# Patient Record
Sex: Female | Born: 1990 | Race: White | Hispanic: No | Marital: Single | Smoking: Current every day smoker
Health system: Southern US, Community
[De-identification: ages and names within clinical notes are randomized; demographics above are authoritative.]

## PROBLEM LIST (undated history)

## (undated) ENCOUNTER — Inpatient Hospital Stay (HOSPITAL_COMMUNITY): Payer: Medicaid Other

## (undated) DIAGNOSIS — A5901 Trichomonal vulvovaginitis: Secondary | ICD-10-CM

## (undated) DIAGNOSIS — Z789 Other specified health status: Secondary | ICD-10-CM

## (undated) HISTORY — PX: NO PAST SURGERIES: SHX2092

---
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
## 2010-06-02 ENCOUNTER — Emergency Department: Payer: Self-pay | Admitting: Emergency Medicine

## 2010-06-06 ENCOUNTER — Emergency Department: Payer: Self-pay | Admitting: Emergency Medicine

## 2010-06-26 ENCOUNTER — Emergency Department (HOSPITAL_COMMUNITY)
Admission: EM | Admit: 2010-06-26 | Discharge: 2010-06-26 | Disposition: A | Discharge status: Home / Self Care | Payer: Self-pay | Attending: Emergency Medicine | Admitting: Emergency Medicine

## 2010-06-26 DIAGNOSIS — S62309A Unspecified fracture of unspecified metacarpal bone, initial encounter for closed fracture: Secondary | ICD-10-CM | POA: Insufficient documentation

## 2010-06-26 DIAGNOSIS — M79609 Pain in unspecified limb: Secondary | ICD-10-CM | POA: Insufficient documentation

## 2010-06-26 DIAGNOSIS — X58XXXA Exposure to other specified factors, initial encounter: Secondary | ICD-10-CM | POA: Insufficient documentation

## 2010-06-27 ENCOUNTER — Emergency Department: Payer: Self-pay | Admitting: Emergency Medicine

## 2010-09-27 ENCOUNTER — Emergency Department: Payer: Self-pay | Admitting: Emergency Medicine

## 2010-09-30 ENCOUNTER — Emergency Department: Payer: Self-pay | Admitting: Emergency Medicine

## 2010-12-05 ENCOUNTER — Emergency Department (HOSPITAL_COMMUNITY)
Admission: EM | Admit: 2010-12-05 | Discharge: 2010-12-05 | Disposition: A | Discharge status: Home / Self Care | Payer: Self-pay | Attending: Emergency Medicine | Admitting: Emergency Medicine

## 2010-12-05 ENCOUNTER — Emergency Department (HOSPITAL_COMMUNITY): Payer: Self-pay

## 2010-12-05 DIAGNOSIS — M79609 Pain in unspecified limb: Secondary | ICD-10-CM | POA: Insufficient documentation

## 2010-12-05 DIAGNOSIS — S6990XA Unspecified injury of unspecified wrist, hand and finger(s), initial encounter: Secondary | ICD-10-CM | POA: Insufficient documentation

## 2010-12-05 DIAGNOSIS — W19XXXA Unspecified fall, initial encounter: Secondary | ICD-10-CM | POA: Insufficient documentation

## 2010-12-05 DIAGNOSIS — S60229A Contusion of unspecified hand, initial encounter: Secondary | ICD-10-CM | POA: Insufficient documentation

## 2010-12-05 DIAGNOSIS — M7989 Other specified soft tissue disorders: Secondary | ICD-10-CM | POA: Insufficient documentation

## 2011-02-04 ENCOUNTER — Emergency Department: Payer: Self-pay | Admitting: *Deleted

## 2011-03-04 ENCOUNTER — Emergency Department (HOSPITAL_COMMUNITY)
Admission: EM | Admit: 2011-03-04 | Discharge: 2011-03-04 | Disposition: A | Discharge status: Home / Self Care | Payer: Self-pay | Attending: Emergency Medicine | Admitting: Emergency Medicine

## 2011-03-04 DIAGNOSIS — M549 Dorsalgia, unspecified: Secondary | ICD-10-CM | POA: Insufficient documentation

## 2011-03-04 DIAGNOSIS — O2390 Unspecified genitourinary tract infection in pregnancy, unspecified trimester: Secondary | ICD-10-CM | POA: Insufficient documentation

## 2011-03-04 DIAGNOSIS — R300 Dysuria: Secondary | ICD-10-CM | POA: Insufficient documentation

## 2011-03-04 DIAGNOSIS — N390 Urinary tract infection, site not specified: Secondary | ICD-10-CM | POA: Insufficient documentation

## 2011-03-04 LAB — URINE MICROSCOPIC-ADD ON

## 2011-03-04 LAB — URINALYSIS, ROUTINE W REFLEX MICROSCOPIC
Nitrite: NEGATIVE
Specific Gravity, Urine: 1.008 (ref 1.005–1.030)
Urobilinogen, UA: 0.2 mg/dL (ref 0.0–1.0)

## 2011-03-06 LAB — URINE CULTURE
Colony Count: >=100000
Culture  Setup Time: 201210192033

## 2011-03-16 LAB — OB RESULTS CONSOLE ANTIBODY SCREEN: Antibody Screen: NEGATIVE

## 2011-03-16 LAB — OB RESULTS CONSOLE RUBELLA ANTIBODY, IGM: Rubella: IMMUNE

## 2011-03-16 LAB — OB RESULTS CONSOLE VARICELLA ZOSTER ANTIBODY, IGG: Varicella: IMMUNE

## 2011-03-16 LAB — OB RESULTS CONSOLE ABO/RH

## 2011-03-16 LAB — OB RESULTS CONSOLE HEPATITIS B SURFACE ANTIGEN: Hepatitis B Surface Ag: NEGATIVE

## 2011-03-16 LAB — OB RESULTS CONSOLE PLATELET COUNT: Platelets: 282 10*3/uL

## 2011-05-17 NOTE — L&D Delivery Note (Signed)
Delivery Note At 9:59 AM a viable and healthy female was delivered via Vaginal, Spontaneous Delivery (Presentation: ; Occiput Anterior).  APGAR: 8, 9; weight .   Placenta status: Intact, Spontaneous.  Cord: 3 vessels with the following complications: .  Cord pH: N/a  Anesthesia: Epidural  Episiotomy: None Lacerations: None Suture Repair: none Est. Blood Loss (mL): 400  Mom to postpartum.  Baby to nursery-stable.  RIGBY, MICHAEL 10/25/2011, 10:17 AM  I was present for this delivery.  I agree with the above note. After delivery patient had 350 mL of bleeding.  Cytotec PR given with uterine massage with good results.    Levie Heritage, DO 10/25/2011 10:31 AM

## 2011-09-29 LAB — OB RESULTS CONSOLE GBS: GBS: NEGATIVE

## 2011-10-14 ENCOUNTER — Observation Stay: Payer: Self-pay | Admitting: Obstetrics and Gynecology

## 2011-10-24 ENCOUNTER — Inpatient Hospital Stay (HOSPITAL_COMMUNITY): Payer: Medicaid Other | Admitting: Anesthesiology

## 2011-10-24 ENCOUNTER — Inpatient Hospital Stay (HOSPITAL_COMMUNITY)
Admission: AD | Admit: 2011-10-24 | Discharge: 2011-10-26 | DRG: 775 | Disposition: A | Discharge status: Home / Self Care | Payer: Medicaid Other | Attending: Obstetrics and Gynecology | Admitting: Obstetrics and Gynecology

## 2011-10-24 ENCOUNTER — Encounter (HOSPITAL_COMMUNITY): Payer: Self-pay | Admitting: Anesthesiology

## 2011-10-24 ENCOUNTER — Encounter (HOSPITAL_COMMUNITY): Payer: Self-pay | Admitting: *Deleted

## 2011-10-24 ENCOUNTER — Observation Stay: Payer: Self-pay | Admitting: Advanced Practice Midwife

## 2011-10-24 DIAGNOSIS — IMO0001 Reserved for inherently not codable concepts without codable children: Secondary | ICD-10-CM

## 2011-10-24 DIAGNOSIS — O99334 Smoking (tobacco) complicating childbirth: Principal | ICD-10-CM | POA: Diagnosis present

## 2011-10-24 HISTORY — DX: Other specified health status: Z78.9

## 2011-10-24 LAB — CBC
HCT: 29.5 % — ABNORMAL LOW (ref 36.0–46.0)
Hemoglobin: 9.9 g/dL — ABNORMAL LOW (ref 12.0–15.0)
MCV: 87.8 fL (ref 78.0–100.0)
RDW: 13.9 % (ref 11.5–15.5)
WBC: 14.4 10*3/uL — ABNORMAL HIGH (ref 4.0–10.5)

## 2011-10-24 MED ORDER — OXYTOCIN 20 UNITS IN LACTATED RINGERS INFUSION - SIMPLE
1.0000 m[IU]/min | INTRAVENOUS | Status: DC
  Administered 2011-10-24: 1 m[IU]/min via INTRAVENOUS
  Filled 2011-10-24: qty 1000

## 2011-10-24 MED ORDER — PHENYLEPHRINE 40 MCG/ML (10ML) SYRINGE FOR IV PUSH (FOR BLOOD PRESSURE SUPPORT): 80.0000 ug | PREFILLED_SYRINGE | INTRAVENOUS | Status: DC | PRN

## 2011-10-24 MED ORDER — CITRIC ACID-SODIUM CITRATE 334-500 MG/5ML PO SOLN: 30.0000 mL | ORAL | Status: DC | PRN

## 2011-10-24 MED ORDER — IBUPROFEN 600 MG PO TABS: 600.0000 mg | ORAL_TABLET | Freq: Four times a day (QID) | ORAL | Status: DC | PRN

## 2011-10-24 MED ORDER — DIPHENHYDRAMINE HCL 50 MG/ML IJ SOLN
12.5000 mg | INTRAMUSCULAR | Status: DC | PRN
  Administered 2011-10-25: 12.5 mg via INTRAVENOUS
  Filled 2011-10-24: qty 1

## 2011-10-24 MED ORDER — NALBUPHINE SYRINGE 5 MG/0.5 ML
10.0000 mg | INJECTION | INTRAMUSCULAR | Status: DC | PRN
  Administered 2011-10-24: 10 mg via INTRAVENOUS
  Filled 2011-10-24: qty 1

## 2011-10-24 MED ORDER — ONDANSETRON HCL 4 MG/2ML IJ SOLN
4.0000 mg | Freq: Four times a day (QID) | INTRAMUSCULAR | Status: DC | PRN
  Administered 2011-10-24: 4 mg via INTRAVENOUS
  Filled 2011-10-24: qty 2

## 2011-10-24 MED ORDER — PHENYLEPHRINE 40 MCG/ML (10ML) SYRINGE FOR IV PUSH (FOR BLOOD PRESSURE SUPPORT)
80.0000 ug | PREFILLED_SYRINGE | INTRAVENOUS | Status: DC | PRN
  Filled 2011-10-24: qty 5

## 2011-10-24 MED ORDER — LIDOCAINE HCL (PF) 1 % IJ SOLN
INTRAMUSCULAR | Status: DC | PRN
  Administered 2011-10-24 (×2): 8 mL

## 2011-10-24 MED ORDER — LACTATED RINGERS IV SOLN
INTRAVENOUS | Status: DC
  Administered 2011-10-25: 06:00:00 via INTRAVENOUS

## 2011-10-24 MED ORDER — EPHEDRINE 5 MG/ML INJ: 10.0000 mg | INTRAVENOUS | Status: DC | PRN

## 2011-10-24 MED ORDER — FENTANYL 2.5 MCG/ML BUPIVACAINE 1/10 % EPIDURAL INFUSION (WH - ANES)
INTRAMUSCULAR | Status: DC | PRN
  Administered 2011-10-24: 14 mL/h via EPIDURAL

## 2011-10-24 MED ORDER — OXYTOCIN BOLUS FROM INFUSION
500.0000 mL | Freq: Once | INTRAVENOUS | Status: AC
  Administered 2011-10-25: 500 mL via INTRAVENOUS
  Filled 2011-10-24: qty 500

## 2011-10-24 MED ORDER — EPHEDRINE 5 MG/ML INJ
10.0000 mg | INTRAVENOUS | Status: DC | PRN
  Filled 2011-10-24: qty 4

## 2011-10-24 MED ORDER — SODIUM CHLORIDE 0.9 % IJ SOLN
INTRAMUSCULAR | Status: AC
  Filled 2011-10-24: qty 3

## 2011-10-24 MED ORDER — TERBUTALINE SULFATE 1 MG/ML IJ SOLN: 0.2500 mg | Freq: Once | INTRAMUSCULAR | Status: AC | PRN

## 2011-10-24 MED ORDER — LACTATED RINGERS IV SOLN: 500.0000 mL | Freq: Once | INTRAVENOUS | Status: DC

## 2011-10-24 MED ORDER — OXYCODONE-ACETAMINOPHEN 5-325 MG PO TABS: 1.0000 | ORAL_TABLET | ORAL | Status: DC | PRN

## 2011-10-24 MED ORDER — LACTATED RINGERS IV SOLN
500.0000 mL | INTRAVENOUS | Status: DC | PRN
  Administered 2011-10-25: 1000 mL via INTRAVENOUS

## 2011-10-24 MED ORDER — OXYTOCIN 20 UNITS IN LACTATED RINGERS INFUSION - SIMPLE: 125.0000 mL/h | Freq: Once | INTRAVENOUS | Status: DC

## 2011-10-24 MED ORDER — NALBUPHINE SYRINGE 5 MG/0.5 ML
5.0000 mg | INJECTION | INTRAMUSCULAR | Status: DC | PRN
  Administered 2011-10-24: 5 mg via INTRAVENOUS
  Filled 2011-10-24: qty 0.5

## 2011-10-24 MED ORDER — ACETAMINOPHEN 325 MG PO TABS: 650.0000 mg | ORAL_TABLET | ORAL | Status: DC | PRN

## 2011-10-24 MED ORDER — LIDOCAINE HCL (PF) 1 % IJ SOLN
30.0000 mL | INTRAMUSCULAR | Status: DC | PRN
  Filled 2011-10-24 (×2): qty 30

## 2011-10-24 MED ORDER — FLEET ENEMA 7-19 GM/118ML RE ENEM: 1.0000 | ENEMA | RECTAL | Status: DC | PRN

## 2011-10-24 MED ORDER — FENTANYL 2.5 MCG/ML BUPIVACAINE 1/10 % EPIDURAL INFUSION (WH - ANES)
14.0000 mL/h | INTRAMUSCULAR | Status: DC
  Administered 2011-10-25 (×3): 14 mL/h via EPIDURAL
  Filled 2011-10-24 (×4): qty 60

## 2011-10-24 NOTE — Progress Notes (Signed)
Tricia Miles is a 21 y.o. G1P0000 at [redacted]w[redacted]d   Subjective: Comfortable with epid  Objective: BP 122/80  Pulse 72  Temp(Src) 97.7 F (36.5 C) (Axillary)  Resp 18  Ht 5\' 7"  (1.702 m)  Wt 69.4 kg (153 lb)  BMI 23.96 kg/m2  SpO2 100%  LMP 11/28/2010      FHT:  FHR: 115 bpm, variability: moderate,  accelerations:  Present,  decelerations:  Absent UC:   irregular, every 4-10 minutes, 40-50sec SVE:   Dilation: 2.5 Effacement (%): 80 Station: -1 Exam by:: Kim- unchanged  Labs: Lab Results  Component Value Date   WBC 14.4* 10/24/2011   HGB 9.9* 10/24/2011   HCT 29.5* 10/24/2011   MCV 87.8 10/24/2011   PLT 156 10/24/2011    Assessment / Plan: IUP at term Latent vs false labor Epidural   Questioning whether pt is actually in labor, but already with epidural in place Will begin Pitocin   Cam Hai 10/24/2011, 9:48 PM

## 2011-10-24 NOTE — ED Notes (Signed)
Grant Fontana CNM to transfer patient via [facility] to [facility] for further evaluation. Will notify MAU charge and FP providers.

## 2011-10-24 NOTE — ED Notes (Signed)
[facility] here to [facility]

## 2011-10-24 NOTE — ED Provider Notes (Signed)
History     CSN: [id number]  Arrival date & time 10/24/11  1302   First MD Initiated Contact with Patient 10/24/11 1323      Chief Complaint  Patient presents with  . pregnant overdue/contracting     (Consider location/radiation/quality/duration/timing/severity/associated sxs/prior treatment) HPI History from patient. 21 year old female who presents with contractions. She reports that this is her first pregnancy and her due date was yesterday. She lives in [place] and her OB is in [place] - states she has an appt later this week to discuss induction. She reports that she began to have what feels like contractions at around 4 AM this morning, that are now about 7-10 min apart. Denies any loss of clear fluid but has noted some dark, bloody-appearing vaginal discharge. Last menstrual period was early September. Pregnancy has been uncomplicated thus far. Denies any pain or other sx other than ctrx. Level V caveat for urgent need for intervention.  History reviewed. No pertinent past medical history.  History reviewed. No pertinent past surgical history.  No family history on file.  History  Substance Use Topics  . Smoking status: Current Everyday Smoker  . Smokeless tobacco: Not on file  . Alcohol Use: No    OB History    Grav Para Term Preterm Abortions TAB SAB Ect Mult Living   1               Review of Systems  Unable to perform ROS: Other    Allergies  Tramadol and Vicodin  Home Medications  No current outpatient prescriptions on file.  BP 126/88  Pulse 100  Temp(Src) 98.2 F (36.8 C) (Oral)  Resp 23  SpO2 100%  LMP 11/28/2010  Physical Exam  Nursing note and vitals reviewed. Constitutional: She appears well-developed and well-nourished. No distress.  HENT:  Head: Normocephalic and atraumatic.  Neck: Normal range of motion.  Cardiovascular: Normal rate, regular rhythm and normal heart sounds.   Pulmonary/Chest: Effort normal and breath sounds normal.  She exhibits no tenderness.  Abdominal: Soft.       Gravid, appears at term  Genitourinary:       Deferred to RN  Musculoskeletal: Normal range of motion.  Neurological: She is alert.  Skin: Skin is warm and dry. She is not diaphoretic.  Psychiatric: She has a normal mood and affect.    ED Course  Procedures (including critical care time)  Labs Reviewed - No data to display No results found.   1. Active labor       MDM  1:16 PM Spoke with rapid response OB RN by phone  1:20 PM OB RN at bedside  1:35 PM RN reports cervix is ~1.5cm dilated, 50% effaced with vertex presentation and what appears to be reg ctrx  1:59 PM [facility] is at bedside to [facility], with Rana Snare as accepting MD      Grant Fontana, PA-C 10/24/11 1401

## 2011-10-24 NOTE — Anesthesia Procedure Notes (Signed)
Epidural Patient location during procedure: OB Start time: 10/24/2011 8:37 PM End time: 10/24/2011 8:41 PM Reason for block: procedure for pain  Staffing Anesthesiologist: Sandrea Hughs Performed by: anesthesiologist   Preanesthetic Checklist Completed: patient identified, site marked, surgical consent, pre-op evaluation, timeout performed, IV checked, risks and benefits discussed and monitors and equipment checked  Epidural Patient position: sitting Prep: site prepped and draped and DuraPrep Patient monitoring: continuous pulse ox and blood pressure Approach: midline Injection technique: LOR air  Needle:  Needle type: Tuohy  Needle gauge: 17 G Needle length: 9 cm Needle insertion depth: 5 cm cm Catheter type: closed end flexible Catheter size: 19 Gauge Catheter at skin depth: 10 cm Test dose: negative and Other  Assessment Sensory level: T9 Events: blood not aspirated, injection not painful, no injection resistance, negative IV test and no paresthesia

## 2011-10-24 NOTE — ED Notes (Signed)
Notified MAU charge and D. Poe CNM of patients status and that Patient to be transferred to MAU via [facility].

## 2011-10-24 NOTE — H&P (Signed)
Tricia Miles is a 21 y.o. female presenting for contractions following transfer from [facility] ED.  Maternal Medical History:  Reason for admission: Reason for admission: contractions.  Reason for Admission:   nauseaContractions: Onset was 6-12 hours ago.   Frequency: regular.   Perceived severity is strong.    Fetal activity: Perceived fetal activity is normal.   Last perceived fetal movement was within the past hour.    Prenatal complications: Substance abuse (Cigarettes).   No cholelithiasis, HIV, hypertension, infection, IUGR, nephrolithiasis, oligohydramnios, placental abnormality, polyhydramnios, pre-eclampsia, preterm labor, thrombocytopenia or thrombophilia.   Bleeding: with loss of mucous plug; no bright red blood.   Followed by [facility] in [place], [place]; was to delivery at [facility] but pt states she never intended to deliver there.  Has not informed her primary OB of this  Prenatal Complications - Diabetes: none.    OB History    Grav Para Term Preterm Abortions TAB SAB Ect Mult Living   1 0 0 0 0 0 0 0 0 0      Past Medical History  Diagnosis Date  . No pertinent past medical history    Past Surgical History  Procedure Date  . No past surgeries    Family History: family history is negative for Anesthesia problems. Social History:  reports that she has been smoking Cigarettes.  She has been smoking about .3 packs per day. She does not have any smokeless tobacco history on file. She reports that she does not drink alcohol or use illicit drugs.  Review of Systems  Constitutional: Negative for fever and chills.  HENT: Negative for congestion.   Eyes: Negative for blurred vision and double vision.  Respiratory: Negative for cough.   Cardiovascular: Negative for chest pain, palpitations and claudication.  Gastrointestinal: Positive for abdominal pain (with contractions). Negative for heartburn, nausea, vomiting, diarrhea, constipation, blood in stool and melena.    Genitourinary: Negative for dysuria, urgency and frequency.  Musculoskeletal: Negative.   Skin: Negative.   Neurological: Negative.  Negative for headaches.  Endo/Heme/Allergies: Negative.   Psychiatric/Behavioral: Negative.     Dilation: 2.5 Effacement (%): 80 Station: +1 Exam by:: D Poe, CNM Blood pressure 129/67, pulse 90, temperature 97.9 F (36.6 C), temperature source Oral, resp. rate 18, height 5\' 7"  (1.702 m), weight 69.4 kg (153 lb), last menstrual period 11/28/2010, SpO2 100.00%. Maternal Exam:  Uterine Assessment: Contraction strength is firm.  Contraction frequency is regular.   Abdomen: Fundal height is appropriate for age.   Fetal presentation: vertex  Introitus: Normal vulva. Normal vagina.  Ferning test: not done.  Nitrazine test: not done. Amniotic fluid character: not assessed.  Pelvis: adequate for delivery.   Cervix: Cervix evaluated by digital exam.     Fetal Exam Fetal Monitor Review: Mode: ultrasound.   Baseline rate: 120.  Variability: moderate (6-25 bpm).   Pattern: accelerations present and no decelerations.    Fetal State Assessment: Category I - tracings are normal.    Dilation: 2.5 Effacement (%): 80 Cervical Position: Posterior Station: +1 Presentation: Vertex Exam by:: D Poe, CNM  Physical Exam  Constitutional: She is oriented to person, place, and time. She appears well-developed and well-nourished. She appears distressed (with contractions).  HENT:  Head: Normocephalic and atraumatic.  Eyes: Conjunctivae are normal. Right eye exhibits no discharge. Left eye exhibits no discharge. No scleral icterus.  Cardiovascular: Normal rate, regular rhythm, normal heart sounds and intact distal pulses.  Exam reveals no gallop and no friction rub.   No  murmur heard. Respiratory: Effort normal and breath sounds normal. No respiratory distress. She has no wheezes. She has no rales. She exhibits no tenderness.  GI: Soft. Bowel sounds are normal.  She exhibits distension and mass. There is no tenderness. There is no rebound and no guarding.  Genitourinary: Vagina normal and uterus normal.  Musculoskeletal: Normal range of motion. She exhibits no edema and no tenderness.  Neurological: She is alert and oriented to person, place, and time. She exhibits normal muscle tone. Coordination normal.  Skin: Skin is warm and dry. No rash noted. She is not diaphoretic. No erythema. No pallor.  Psychiatric: She has a normal mood and affect. Her behavior is normal. Judgment and thought content normal.    Prenatal labs: ABO, Rh: O/Positive/-- (10/31 1137) Antibody: Negative (10/31 1137) Rubella: Immune (10/31 1137) RPR: Nonreactive (10/31 1137)  HBsAg: Negative (10/31 1137)  HIV: Non-reactive (10/31 1137)  GBS: Negative (05/16 1647)   Assessment/Plan: Active labor with regular contractions and cervical effacement.  Will admit to L&D with expectant delivery.  Nubain for pain until cervical dilation >4cm.  ?Fetal Cardiac Evaluation given pt deferment of prenatal Fetal ECHO.  Tobacco Abuse: monitor, consider NRT if symptomatic but low nicotine dependence as only smoking 1/3 ppd at most   Andrena Mews, DO [facility] Resident - PGY-1 10/24/2011 6:00 PM

## 2011-10-24 NOTE — Care Management (Signed)
External monitors taken off per C. Williams CNM. Patient to be transferred via [facility] to [facility]U.

## 2011-10-24 NOTE — MAU Note (Signed)
Pt sent over from [facility] for labor eval.  Pt gets prenatal care in [place], states she never planned to deliver at [facility].  Reports small amount of vaginal bleeding.  States ucs started around 0400 every 2-3 minutes.  Denies any leaking of fluid.  + FM.

## 2011-10-24 NOTE — ED Notes (Signed)
Rapid response nurse at bedside.

## 2011-10-24 NOTE — ED Notes (Signed)
Pt transferred to [facility] in [place]. Report given to [facility] RN. Pt voiced understanding of need to transfer.

## 2011-10-24 NOTE — Anesthesia Preprocedure Evaluation (Signed)

## 2011-10-24 NOTE — ED Notes (Signed)
Pt is here with complaints of contractions.  This is patients first baby and reports contractions that started at 0400 with some dark blood vaginal discharge.  Pt rates contracting pains 10/10.

## 2011-10-24 NOTE — ED Provider Notes (Signed)
Medical screening examination/treatment/procedure(s) were conducted as a shared visit with non-physician practitioner(s) and myself.  I personally evaluated the patient during the encounter  Pt seen and evaluated- she is 40 and 1/7 weeks, contractions every 1-2 minutes, fetal heart rate good.  Pt seen by OB rapid response and transferred to MAU for further evaluation and management  Ethelda Chick, MD 10/24/11 1446

## 2011-10-25 ENCOUNTER — Encounter (HOSPITAL_COMMUNITY): Payer: Self-pay

## 2011-10-25 DIAGNOSIS — O99334 Smoking (tobacco) complicating childbirth: Secondary | ICD-10-CM

## 2011-10-25 LAB — ABO/RH: ABO/RH(D): O POS

## 2011-10-25 LAB — MRSA PCR SCREENING: MRSA by PCR: POSITIVE — AB

## 2011-10-25 MED ORDER — MISOPROSTOL 200 MCG PO TABS
1000.0000 ug | ORAL_TABLET | Freq: Once | ORAL | Status: AC
  Administered 2011-10-25: 1000 ug via RECTAL

## 2011-10-25 MED ORDER — MUPIROCIN 2 % EX OINT
1.0000 "application " | TOPICAL_OINTMENT | Freq: Two times a day (BID) | CUTANEOUS | Status: DC
  Administered 2011-10-25 – 2011-10-26 (×3): 1 via NASAL
  Filled 2011-10-25: qty 22

## 2011-10-25 MED ORDER — DIBUCAINE 1 % RE OINT: 1.0000 "application " | TOPICAL_OINTMENT | RECTAL | Status: DC | PRN

## 2011-10-25 MED ORDER — METHYLERGONOVINE MALEATE 0.2 MG/ML IJ SOLN: 0.2000 mg | INTRAMUSCULAR | Status: DC | PRN

## 2011-10-25 MED ORDER — METHYLERGONOVINE MALEATE 0.2 MG PO TABS: 0.2000 mg | ORAL_TABLET | ORAL | Status: DC | PRN

## 2011-10-25 MED ORDER — DIPHENHYDRAMINE HCL 25 MG PO CAPS: 25.0000 mg | ORAL_CAPSULE | Freq: Four times a day (QID) | ORAL | Status: DC | PRN

## 2011-10-25 MED ORDER — BENZOCAINE-MENTHOL 20-0.5 % EX AERO
1.0000 "application " | INHALATION_SPRAY | CUTANEOUS | Status: DC | PRN
  Filled 2011-10-25: qty 56

## 2011-10-25 MED ORDER — SENNOSIDES-DOCUSATE SODIUM 8.6-50 MG PO TABS
2.0000 | ORAL_TABLET | Freq: Every day | ORAL | Status: DC
  Administered 2011-10-25: 2 via ORAL

## 2011-10-25 MED ORDER — TETANUS-DIPHTH-ACELL PERTUSSIS 5-2.5-18.5 LF-MCG/0.5 IM SUSP: 0.5000 mL | Freq: Once | INTRAMUSCULAR | Status: DC

## 2011-10-25 MED ORDER — OXYCODONE-ACETAMINOPHEN 5-325 MG PO TABS
1.0000 | ORAL_TABLET | ORAL | Status: DC | PRN
Start: 2011-10-25 — End: 2011-10-26
  Administered 2011-10-25 (×3): 1 via ORAL
  Administered 2011-10-26 (×3): 2 via ORAL
  Administered 2011-10-26: 1 via ORAL
  Filled 2011-10-25: qty 1
  Filled 2011-10-25 (×2): qty 2
  Filled 2011-10-25 (×2): qty 1
  Filled 2011-10-25: qty 2
  Filled 2011-10-25: qty 1

## 2011-10-25 MED ORDER — ONDANSETRON HCL 4 MG/2ML IJ SOLN: 4.0000 mg | INTRAMUSCULAR | Status: DC | PRN

## 2011-10-25 MED ORDER — LANOLIN HYDROUS EX OINT: TOPICAL_OINTMENT | CUTANEOUS | Status: DC | PRN

## 2011-10-25 MED ORDER — WITCH HAZEL-GLYCERIN EX PADS: 1.0000 "application " | MEDICATED_PAD | CUTANEOUS | Status: DC | PRN

## 2011-10-25 MED ORDER — ONDANSETRON HCL 4 MG PO TABS: 4.0000 mg | ORAL_TABLET | ORAL | Status: DC | PRN

## 2011-10-25 MED ORDER — SIMETHICONE 80 MG PO CHEW: 80.0000 mg | CHEWABLE_TABLET | ORAL | Status: DC | PRN

## 2011-10-25 MED ORDER — ZOLPIDEM TARTRATE 5 MG PO TABS: 5.0000 mg | ORAL_TABLET | Freq: Every evening | ORAL | Status: DC | PRN

## 2011-10-25 MED ORDER — CHLORHEXIDINE GLUCONATE CLOTH 2 % EX PADS
6.0000 | MEDICATED_PAD | Freq: Every day | CUTANEOUS | Status: DC
  Administered 2011-10-25 – 2011-10-26 (×2): 6 via TOPICAL

## 2011-10-25 MED ORDER — PRENATAL MULTIVITAMIN CH
1.0000 | ORAL_TABLET | Freq: Every day | ORAL | Status: DC
  Filled 2011-10-25: qty 1

## 2011-10-25 MED ORDER — IBUPROFEN 600 MG PO TABS
600.0000 mg | ORAL_TABLET | Freq: Four times a day (QID) | ORAL | Status: DC
  Administered 2011-10-25 – 2011-10-26 (×6): 600 mg via ORAL
  Filled 2011-10-25 (×6): qty 1

## 2011-10-25 MED ORDER — MISOPROSTOL 200 MCG PO TABS
ORAL_TABLET | ORAL | Status: AC
  Administered 2011-10-25: 1000 ug via RECTAL
  Filled 2011-10-25: qty 5

## 2011-10-25 NOTE — Progress Notes (Signed)
Tricia Miles is a 21 y.o. G1P0000 at [redacted]w[redacted]d   Subjective: Comfortable with epid  Objective: BP 128/90  Pulse 89  Temp(Src) 98 F (36.7 C) (Oral)  Resp 18  Ht 5\' 7"  (1.702 m)  Wt 69.4 kg (153 lb)  BMI 23.96 kg/m2  SpO2 100%  LMP 11/28/2010   Total I/O In: -  Out: 550 [Urine:550]  FHT:  FHR: 120 bpm, variability: moderate,  accelerations:  Present,  decelerations:  Absent UC:   regular, every 3-4 minutes SVE:   Dilation: 7 Effacement (%): 100 Station: 0 Exam by:: Pincus Badder CNM  Labs: Lab Results  Component Value Date   WBC 14.4* 10/24/2011   HGB 9.9* 10/24/2011   HCT 29.5* 10/24/2011   MCV 87.8 10/24/2011   PLT 156 10/24/2011    Assessment / Plan: Active labor  Continue to keep ctx reg with Pitocin Reexamine cx in approx 2 hours Anticipate SVD  Tricia Miles 10/25/2011, 5:56 AM

## 2011-10-26 LAB — CBC
Platelets: 136 10*3/uL — ABNORMAL LOW (ref 150–400)
RBC: 2.85 MIL/uL — ABNORMAL LOW (ref 3.87–5.11)
WBC: 13.6 10*3/uL — ABNORMAL HIGH (ref 4.0–10.5)

## 2011-10-26 MED ORDER — OXYCODONE-ACETAMINOPHEN 5-325 MG PO TABS: 1.0000 | ORAL_TABLET | ORAL | Status: DC | PRN

## 2011-10-26 MED ORDER — IBUPROFEN 600 MG PO TABS: 600.0000 mg | ORAL_TABLET | Freq: Four times a day (QID) | ORAL | Status: AC

## 2011-10-26 NOTE — Progress Notes (Signed)
UR chart review completed.  

## 2011-10-26 NOTE — Discharge Instructions (Signed)

## 2011-10-26 NOTE — Anesthesia Postprocedure Evaluation (Signed)
Anesthesia Post Note  Patient: Tricia Miles  Procedure(s) Performed: * No procedures listed *  Anesthesia type: Epidural  Patient location: Mother/Baby  Post pain: Pain level controlled  Post assessment: Post-op Vital signs reviewed  Last Vitals: There were no vitals filed for this visit.  Post vital signs: Reviewed  Level of consciousness: awake  Complications: No apparent anesthesia complications

## 2011-10-26 NOTE — Discharge Summary (Signed)
Obstetric Discharge Summary Reason for Admission: Regular contractions/latent labor Prenatal Procedures: none Intrapartum Procedures: spontaneous vaginal delivery Postpartum Procedures: none Complications-Operative and Postpartum: none Hemoglobin  Date Value Range Status  10/26/2011 8.3* 12.0 - 15.0 g/dL Final  69/62/9528 41.3   Final     HCT  Date Value Range Status  10/26/2011 24.9* 36.0 - 46.0 % Final  03/16/2011 37   Final    Physical Exam:  General: alert, cooperative and no distress Heart: reg rate Lungs: effort nl Lochia: appropriate Uterine Fundus: firm DVT Evaluation: No evidence of DVT seen on physical exam.  Discharge Diagnoses: Term Pregnancy-delivered  Discharge Information: Date: 10/26/2011 Activity: pelvic rest Diet: routine Medications: PNV and Ibuprofen Condition: stable Instructions: refer to practice specific booklet Discharge to: home Follow-up Information    Follow up with [facility]. Schedule an appointment as soon as possible for a visit in 6 weeks.         Newborn Data: Live born female  Birth Weight: 8 lb (3629 g) APGAR: 8, 9  Home with mother.  Cam Hai 10/26/2011, 7:34 AM

## 2011-10-26 NOTE — H&P (Signed)
Agree with above note.  Tricia Miles 10/26/2011 9:45 AM   

## 2012-03-03 ENCOUNTER — Emergency Department: Payer: Self-pay | Admitting: Emergency Medicine

## 2012-05-16 NOTE — L&D Delivery Note (Signed)
Delivery Note At 8:17 AM a viable female was delivered via Vaginal, Spontaneous Delivery (Presentation: Left Occiput Anterior).  APGAR: 9, 9; weight 6 lb 14.6 oz (3135 g).   Placenta status: Intact, Spontaneous.  Cord: 3 vessels with the following complications: None.  Cord pH: N/A.  Anesthesia: None  Episiotomy: None Lacerations: None Est. Blood Loss (mL): 300  Mom to postpartum.  Baby to nursery-stable.  Everlene Other 10/29/2012, 9:09 AM

## 2012-05-16 NOTE — L&D Delivery Note (Signed)
I have seen and examined this patient and I agree with the above. Cam Hai 9:25 AM 10/29/2012

## 2012-06-08 LAB — OB RESULTS CONSOLE GC/CHLAMYDIA
Chlamydia: NEGATIVE
Gonorrhea: NEGATIVE

## 2012-07-10 ENCOUNTER — Observation Stay: Payer: Self-pay

## 2012-09-05 LAB — OB RESULTS CONSOLE RPR: RPR: NONREACTIVE

## 2012-09-05 LAB — OB RESULTS CONSOLE HIV ANTIBODY (ROUTINE TESTING): HIV: NONREACTIVE

## 2012-09-05 LAB — OB RESULTS CONSOLE RUBELLA ANTIBODY, IGM: Rubella: IMMUNE

## 2012-10-03 ENCOUNTER — Observation Stay: Payer: Self-pay | Admitting: Obstetrics and Gynecology

## 2012-10-05 ENCOUNTER — Encounter (HOSPITAL_COMMUNITY): Payer: Self-pay | Admitting: *Deleted

## 2012-10-05 ENCOUNTER — Emergency Department (HOSPITAL_COMMUNITY)
Admission: EM | Admit: 2012-10-05 | Discharge: 2012-10-05 | Disposition: A | Discharge status: Home / Self Care | Payer: Medicaid Other | Attending: Emergency Medicine | Admitting: Emergency Medicine

## 2012-10-05 DIAGNOSIS — O99330 Smoking (tobacco) complicating pregnancy, unspecified trimester: Secondary | ICD-10-CM | POA: Insufficient documentation

## 2012-10-05 DIAGNOSIS — O9989 Other specified diseases and conditions complicating pregnancy, childbirth and the puerperium: Secondary | ICD-10-CM | POA: Insufficient documentation

## 2012-10-05 DIAGNOSIS — M545 Low back pain, unspecified: Secondary | ICD-10-CM | POA: Insufficient documentation

## 2012-10-05 DIAGNOSIS — M549 Dorsalgia, unspecified: Secondary | ICD-10-CM

## 2012-10-05 NOTE — ED Provider Notes (Signed)
History     CSN: [id number]  Arrival date & time 10/05/12  1356   First MD Initiated Contact with Patient 10/05/12 1408      Chief Complaint  Patient presents with  . Back Pain    (Consider location/radiation/quality/duration/timing/severity/associated sxs/prior treatment) Patient is a 22 y.o. female presenting with back pain.  Back Pain  Pt reports several days of moderate to severe aching L lower back pain, radiating into her L leg but not associated with numbness, weakness or incontinence. She is approx [redacted]wks pregnant. Denies any abdominal pain, contractions, vaginal bleeding or rupture of membranes. She states she was told by her Ob in [place] to come to the ER.   Past Medical History  Diagnosis Date  . No pertinent past medical history     Past Surgical History  Procedure Laterality Date  . No past surgeries      Family History  Problem Relation Age of Onset  . Anesthesia problems Neg Hx     History  Substance Use Topics  . Smoking status: Current Every Day Smoker -- 0.30 packs/day    Types: Cigarettes  . Smokeless tobacco: Never Used  . Alcohol Use: No    OB History   Grav Para Term Preterm Abortions TAB SAB Ect Mult Living   1 1 1  0 0 0 0 0 0 1      Review of Systems  Musculoskeletal: Positive for back pain.   All other systems reviewed and are negative except as noted in HPI.   Allergies  Tramadol and Vicodin  Home Medications  No current outpatient prescriptions on file.  BP 109/64  Pulse 72  Temp(Src) 98.1 F (36.7 C) (Oral)  Resp 18  SpO2 94%  Physical Exam  Nursing note and vitals reviewed. Constitutional: She is oriented to person, place, and time. She appears well-developed and well-nourished.  HENT:  Head: Normocephalic and atraumatic.  Eyes: EOM are normal. Pupils are equal, round, and reactive to light.  Neck: Normal range of motion. Neck supple.  Cardiovascular: Normal rate, normal heart sounds and intact distal pulses.    Pulmonary/Chest: Effort normal and breath sounds normal.  Abdominal: Bowel sounds are normal. She exhibits no distension. There is no tenderness. There is no rebound and no guarding.  Gravid, consistent with dates  Musculoskeletal: Normal range of motion. She exhibits no edema and no tenderness.  Neurological: She is alert and oriented to person, place, and time. She has normal strength. She displays normal reflexes. No cranial nerve deficit or sensory deficit.  Skin: Skin is warm and dry. No rash noted.  Psychiatric: She has a normal mood and affect.    ED Course  Procedures (including critical care time)  Labs Reviewed - No data to display No results found.   1. Back pain in pregnancy, third trimester       MDM  No low back tenderness, no red flags for acute cord compression. Likely sciatica related to third trimester pregnancy. I spoke to Nurse Midwife at [facility] in [place] who states there is no record of the patient calling there today, agrees with assessment, recommends heating pad, APAP and followup in the clinic.         Lionel Woodberry B. Bernette Mayers, MD 10/05/12 1446

## 2012-10-05 NOTE — ED Notes (Signed)
Pt reports onset of lower back pain x 2 days, radiates down back of left leg and difficulty bearing weight on left side. Pt is approx [redacted] weeks pregnant. Denies any abd pain.

## 2012-10-29 ENCOUNTER — Inpatient Hospital Stay (HOSPITAL_COMMUNITY)
Admission: EM | Admit: 2012-10-29 | Discharge: 2012-10-31 | DRG: 775 | Disposition: A | Discharge status: Home / Self Care | Payer: Medicaid Other | Attending: Obstetrics & Gynecology | Admitting: Obstetrics & Gynecology

## 2012-10-29 ENCOUNTER — Encounter (HOSPITAL_COMMUNITY): Payer: Self-pay | Admitting: *Deleted

## 2012-10-29 DIAGNOSIS — O479 False labor, unspecified: Secondary | ICD-10-CM

## 2012-10-29 DIAGNOSIS — O99334 Smoking (tobacco) complicating childbirth: Principal | ICD-10-CM | POA: Diagnosis present

## 2012-10-29 DIAGNOSIS — IMO0002 Reserved for concepts with insufficient information to code with codable children: Secondary | ICD-10-CM

## 2012-10-29 HISTORY — DX: Other specified health status: Z78.9

## 2012-10-29 HISTORY — DX: Trichomonal vulvovaginitis: A59.01

## 2012-10-29 LAB — CBC
HCT: 28.8 % — ABNORMAL LOW (ref 36.0–46.0)
Hemoglobin: 9.7 g/dL — ABNORMAL LOW (ref 12.0–15.0)
MCH: 27.9 pg (ref 26.0–34.0)
MCHC: 33.7 g/dL (ref 30.0–36.0)
RDW: 15.4 % (ref 11.5–15.5)

## 2012-10-29 LAB — GROUP B STREP BY PCR: Group B strep by PCR: NEGATIVE

## 2012-10-29 LAB — MRSA PCR SCREENING: MRSA by PCR: NEGATIVE

## 2012-10-29 LAB — POCT FERN TEST: POCT Fern Test: POSITIVE

## 2012-10-29 LAB — RAPID URINE DRUG SCREEN, HOSP PERFORMED: Benzodiazepines: NOT DETECTED

## 2012-10-29 MED ORDER — FENTANYL 2.5 MCG/ML BUPIVACAINE 1/10 % EPIDURAL INFUSION (WH - ANES)
14.0000 mL/h | INTRAMUSCULAR | Status: DC | PRN
  Filled 2012-10-29: qty 125

## 2012-10-29 MED ORDER — IBUPROFEN 600 MG PO TABS
600.0000 mg | ORAL_TABLET | Freq: Four times a day (QID) | ORAL | Status: DC
  Administered 2012-10-29 – 2012-10-31 (×9): 600 mg via ORAL
  Filled 2012-10-29 (×10): qty 1

## 2012-10-29 MED ORDER — NALBUPHINE SYRINGE 5 MG/0.5 ML
10.0000 mg | INJECTION | Freq: Four times a day (QID) | INTRAMUSCULAR | Status: DC | PRN
  Administered 2012-10-29: 10 mg via INTRAMUSCULAR
  Filled 2012-10-29 (×2): qty 1

## 2012-10-29 MED ORDER — BENZOCAINE-MENTHOL 20-0.5 % EX AERO
1.0000 "application " | INHALATION_SPRAY | CUTANEOUS | Status: DC | PRN
  Administered 2012-10-29: 1 via TOPICAL
  Filled 2012-10-29: qty 56

## 2012-10-29 MED ORDER — FENTANYL CITRATE 0.05 MG/ML IJ SOLN
100.0000 ug | INTRAMUSCULAR | Status: DC | PRN
  Administered 2012-10-29 (×2): 100 ug via INTRAVENOUS
  Filled 2012-10-29 (×2): qty 2

## 2012-10-29 MED ORDER — MISOPROSTOL 50MCG HALF TABLET
50.0000 ug | ORAL_TABLET | ORAL | Status: DC
  Administered 2012-10-29: 50 ug via ORAL
  Filled 2012-10-29 (×3): qty 1

## 2012-10-29 MED ORDER — PRENATAL MULTIVITAMIN CH
1.0000 | ORAL_TABLET | Freq: Every day | ORAL | Status: DC
  Administered 2012-10-29 – 2012-10-30 (×2): 1 via ORAL
  Filled 2012-10-29 (×2): qty 1

## 2012-10-29 MED ORDER — LANOLIN HYDROUS EX OINT: TOPICAL_OINTMENT | CUTANEOUS | Status: DC | PRN

## 2012-10-29 MED ORDER — DIPHENHYDRAMINE HCL 25 MG PO CAPS: 25.0000 mg | ORAL_CAPSULE | Freq: Four times a day (QID) | ORAL | Status: DC | PRN

## 2012-10-29 MED ORDER — ONDANSETRON HCL 4 MG PO TABS: 4.0000 mg | ORAL_TABLET | ORAL | Status: DC | PRN

## 2012-10-29 MED ORDER — ONDANSETRON HCL 4 MG/2ML IJ SOLN: 4.0000 mg | INTRAMUSCULAR | Status: DC | PRN

## 2012-10-29 MED ORDER — ACETAMINOPHEN 325 MG PO TABS: 650.0000 mg | ORAL_TABLET | ORAL | Status: DC | PRN

## 2012-10-29 MED ORDER — FLEET ENEMA 7-19 GM/118ML RE ENEM: 1.0000 | ENEMA | RECTAL | Status: DC | PRN

## 2012-10-29 MED ORDER — LACTATED RINGERS IV SOLN: INTRAVENOUS | Status: DC

## 2012-10-29 MED ORDER — PHENYLEPHRINE 40 MCG/ML (10ML) SYRINGE FOR IV PUSH (FOR BLOOD PRESSURE SUPPORT)
80.0000 ug | PREFILLED_SYRINGE | INTRAVENOUS | Status: DC | PRN
  Filled 2012-10-29: qty 5
  Filled 2012-10-29: qty 2

## 2012-10-29 MED ORDER — TERBUTALINE SULFATE 1 MG/ML IJ SOLN: 0.2500 mg | Freq: Once | INTRAMUSCULAR | Status: DC | PRN

## 2012-10-29 MED ORDER — OXYCODONE-ACETAMINOPHEN 5-325 MG PO TABS
1.0000 | ORAL_TABLET | ORAL | Status: DC | PRN
  Administered 2012-10-29: 2 via ORAL
  Administered 2012-10-29: 1 via ORAL
  Administered 2012-10-29: 2 via ORAL
  Administered 2012-10-30: 1 via ORAL
  Administered 2012-10-30 – 2012-10-31 (×7): 2 via ORAL
  Filled 2012-10-29: qty 2
  Filled 2012-10-29: qty 1
  Filled 2012-10-29 (×8): qty 2
  Filled 2012-10-29: qty 1
  Filled 2012-10-29: qty 2
  Filled 2012-10-29: qty 1

## 2012-10-29 MED ORDER — OXYTOCIN BOLUS FROM INFUSION: 500.0000 mL | INTRAVENOUS | Status: DC

## 2012-10-29 MED ORDER — NALBUPHINE SYRINGE 5 MG/0.5 ML
10.0000 mg | INJECTION | INTRAMUSCULAR | Status: DC | PRN
  Administered 2012-10-29: 10 mg via INTRAVENOUS
  Filled 2012-10-29 (×2): qty 1

## 2012-10-29 MED ORDER — LACTATED RINGERS IV SOLN: 500.0000 mL | INTRAVENOUS | Status: DC | PRN

## 2012-10-29 MED ORDER — PHENYLEPHRINE 40 MCG/ML (10ML) SYRINGE FOR IV PUSH (FOR BLOOD PRESSURE SUPPORT)
80.0000 ug | PREFILLED_SYRINGE | INTRAVENOUS | Status: DC | PRN
  Filled 2012-10-29: qty 2

## 2012-10-29 MED ORDER — EPHEDRINE 5 MG/ML INJ
10.0000 mg | INTRAVENOUS | Status: DC | PRN
  Filled 2012-10-29: qty 2
  Filled 2012-10-29: qty 4

## 2012-10-29 MED ORDER — DIPHENHYDRAMINE HCL 50 MG/ML IJ SOLN: 12.5000 mg | INTRAMUSCULAR | Status: DC | PRN

## 2012-10-29 MED ORDER — EPHEDRINE 5 MG/ML INJ
10.0000 mg | INTRAVENOUS | Status: DC | PRN
  Filled 2012-10-29: qty 2

## 2012-10-29 MED ORDER — CITRIC ACID-SODIUM CITRATE 334-500 MG/5ML PO SOLN: 30.0000 mL | ORAL | Status: DC | PRN

## 2012-10-29 MED ORDER — SIMETHICONE 80 MG PO CHEW: 80.0000 mg | CHEWABLE_TABLET | ORAL | Status: DC | PRN

## 2012-10-29 MED ORDER — LACTATED RINGERS IV SOLN: 500.0000 mL | Freq: Once | INTRAVENOUS | Status: DC

## 2012-10-29 MED ORDER — ZOLPIDEM TARTRATE 5 MG PO TABS: 5.0000 mg | ORAL_TABLET | Freq: Every evening | ORAL | Status: DC | PRN

## 2012-10-29 MED ORDER — SENNOSIDES-DOCUSATE SODIUM 8.6-50 MG PO TABS
2.0000 | ORAL_TABLET | Freq: Every day | ORAL | Status: DC
  Administered 2012-10-29 – 2012-10-30 (×2): 2 via ORAL

## 2012-10-29 MED ORDER — DIBUCAINE 1 % RE OINT: 1.0000 "application " | TOPICAL_OINTMENT | RECTAL | Status: DC | PRN

## 2012-10-29 MED ORDER — IBUPROFEN 600 MG PO TABS
600.0000 mg | ORAL_TABLET | Freq: Four times a day (QID) | ORAL | Status: DC | PRN
  Administered 2012-10-29: 600 mg via ORAL
  Filled 2012-10-29: qty 1

## 2012-10-29 MED ORDER — OXYTOCIN 40 UNITS IN LACTATED RINGERS INFUSION - SIMPLE MED
62.5000 mL/h | INTRAVENOUS | Status: DC
  Filled 2012-10-29: qty 1000

## 2012-10-29 MED ORDER — WITCH HAZEL-GLYCERIN EX PADS: 1.0000 "application " | MEDICATED_PAD | CUTANEOUS | Status: DC | PRN

## 2012-10-29 MED ORDER — ONDANSETRON HCL 4 MG/2ML IJ SOLN: 4.0000 mg | Freq: Four times a day (QID) | INTRAMUSCULAR | Status: DC | PRN

## 2012-10-29 MED ORDER — TETANUS-DIPHTH-ACELL PERTUSSIS 5-2.5-18.5 LF-MCG/0.5 IM SUSP
0.5000 mL | Freq: Once | INTRAMUSCULAR | Status: AC
  Administered 2012-10-30: 0.5 mL via INTRAMUSCULAR
  Filled 2012-10-29: qty 0.5

## 2012-10-29 MED ORDER — LIDOCAINE HCL (PF) 1 % IJ SOLN
30.0000 mL | INTRAMUSCULAR | Status: DC | PRN
  Filled 2012-10-29 (×2): qty 30

## 2012-10-29 NOTE — ED Notes (Signed)
[facility]  TRANSPORTED [facility] , REPORT GIVEN BY RAPID RESPONSE OB NURSE TO [facility].

## 2012-10-29 NOTE — ED Notes (Signed)
Family at bedside. 

## 2012-10-29 NOTE — ED Notes (Addendum)
Pt is [redacted] weeks pregnant, due 623, water broke at 0245, reports contractions every 2-3 min, lasting for ~ 1 minute, G2P1, smoker, taking NO meds or vitamins, pt of [facility] in [place], last visit was last week, was here in [place] with friends, friends x2 at [facility], reports safe environment, denies depression.

## 2012-10-29 NOTE — MAU Note (Signed)
Pt arrived via [facility] for evaluation of SROM.

## 2012-10-29 NOTE — Progress Notes (Signed)
UR completed 

## 2012-10-29 NOTE — H&P (Signed)
Tricia Miles is a 22 y.o. female G2P1001  At 39.0wks presenting to [facility] for eval of leaking fluid since 0300. Denies bldg. Having ctx but coping well with them. She has received prenatal care at [facility] in [place] and was given the opportunity to be taken by [facility] to [facility], but she declined. Her prenatal records were reviewed and consisted of 5 visits. No significant pregnancy concerns were noted.  GBS PCR collected in MAU- negative. History OB History   Grav Para Term Preterm Abortions TAB SAB Ect Mult Living   2 1 1  0 0 0 0 0 0 1     Past Medical History  Diagnosis Date  . No pertinent past medical history   . Medical history non-contributory   . Trichomonal vulvovaginitis    Past Surgical History  Procedure Laterality Date  . No past surgeries     Family History: family history is negative for Anesthesia problems. Social History:  reports that she has been smoking Cigarettes.  She has been smoking about 0.30 packs per day. She has never used smokeless tobacco. She reports that she does not drink alcohol or use illicit drugs.   Prenatal Transfer Tool  Maternal Diabetes: No Genetic Screening: Declined Maternal Ultrasounds/Referrals: Normal Fetal Ultrasounds or other Referrals:  None Maternal Substance Abuse:  No Significant Maternal Medications:  None Significant Maternal Lab Results:  Lab values include: Group B Strep negative Other Comments:  None  ROS  Dilation: Fingertip Effacement (%): 70 Station: +1 Exam by:: Morrie Sheldon, RN Blood pressure 112/64, pulse 90, temperature 97.9 F (36.6 C), temperature source Oral, resp. rate 20, height 5\' 7"  (1.702 m), weight 60.782 kg (134 lb), SpO2 99.00%, not currently breastfeeding. Exam Physical Exam  Prenatal labs: ABO, Rh: O/Positive/-- (04/23 0000) Antibody: Negative (04/23 0000) Rubella: Immune (04/23 0000) RPR: Nonreactive (04/23 0000)  HBsAg: Negative (04/23 0000)  HIV: Non-reactive (04/23 0000)   GBS: Negative (06/16 0000)   Assessment/Plan: IUP at 39.0wks SROM Early labor  Admit to [employer] Cx was FT/thick at [facility] as well as in MAU- will start Cytotec for ripening Plan UDS and SW consult   Cam Hai 10/29/2012, 7:27 AM

## 2012-10-29 NOTE — ED Provider Notes (Signed)
History     CSN: [id number]  Arrival date & time 10/29/12  4098   First MD Initiated Contact with Patient 10/29/12 0422      Chief Complaint  Patient presents with  . Laboring    (Consider location/radiation/quality/duration/timing/severity/associated sxs/prior treatment) HPI Hx per PT "my water broke" around 0240am.  She is G2P1 39 weeks preg, followed by OB in [place], denies any problems with this pregnancy, is having Cxs every 2-3 min, lower ABD discomfort, no vag bleeding. Symptoms MOD in severity. Her first child was born at [facility] and she would like this baby to be born at [facility] - she declines transfer to [facility]  Past Medical History  Diagnosis Date  . No pertinent past medical history     Past Surgical History  Procedure Laterality Date  . No past surgeries      Family History  Problem Relation Age of Onset  . Anesthesia problems Neg Hx     History  Substance Use Topics  . Smoking status: Current Every Day Smoker -- 0.30 packs/day    Types: Cigarettes  . Smokeless tobacco: Never Used  . Alcohol Use: No    OB History   Grav Para Term Preterm Abortions TAB SAB Ect Mult Living   2 1 1  0 0 0 0 0 0 1      Review of Systems  Constitutional: Negative for fever and chills.  HENT: Negative for neck pain and neck stiffness.   Eyes: Negative for pain.  Respiratory: Negative for shortness of breath.   Cardiovascular: Negative for chest pain.  Gastrointestinal: Negative for vomiting and diarrhea.  Genitourinary: Negative for dysuria.  Musculoskeletal: Negative for back pain.  Skin: Negative for rash.  Neurological: Negative for headaches.  All other systems reviewed and are negative.    Allergies  Codeine; Tramadol; and Vicodin  Home Medications  No current outpatient prescriptions on file.  BP 130/88  Pulse 80  Temp(Src) 97.6 F (36.4 C) (Oral)  Resp 14  SpO2 100%  Breastfeeding? No  Physical Exam  Nursing note and vitals  reviewed. Constitutional: She is oriented to person, place, and time. She appears well-developed and well-nourished.  HENT:  Head: Normocephalic and atraumatic.  Eyes: EOM are normal. Pupils are equal, round, and reactive to light.  Neck: Neck supple.  Cardiovascular: Normal heart sounds and intact distal pulses.   Pulmonary/Chest: Effort normal and breath sounds normal. No respiratory distress.  Abdominal: Soft. There is no rebound and no guarding.  Gravid c/w dates  Genitourinary:  No crowning  Musculoskeletal: Normal range of motion. She exhibits no edema.  Neurological: She is alert and oriented to person, place, and time.  Skin: Skin is warm and dry.    ED Course  Procedures (including critical care time)  Fetal monitoring - FHR 120s  1. Uterine contractions    Rapid OB RN bedisde, sterile Cx exam 1cm, no obvious LOF  4:37 AM d/w Dr Daune Perch who accepts PT in Tx to [facility]U for further evaluation  MDM  Contractions with LOF in [redacted] weeks pregnant G2P1  Fetal monitoring Rapid response OB  Tx to Johnnette Litter, MD 10/29/12 [phone]

## 2012-10-30 MED ORDER — PNEUMOCOCCAL VAC POLYVALENT 25 MCG/0.5ML IJ INJ
0.5000 mL | INJECTION | INTRAMUSCULAR | Status: AC
  Administered 2012-10-31: 0.5 mL via INTRAMUSCULAR
  Filled 2012-10-30: qty 0.5

## 2012-10-30 NOTE — H&P (Signed)
Attestation of Attending Supervision of Advanced Practitioner (PA/CNM/NP): Evaluation and management procedures were performed by the Advanced Practitioner under my supervision and collaboration.  I have reviewed the Advanced Practitioner's note and chart, and I agree with the management and plan.  Emillie Chasen, MD, FACOG Attending Obstetrician & Gynecologist Faculty Practice, [facility]  

## 2012-10-30 NOTE — Progress Notes (Addendum)
Post Partum Day 1 Subjective: Patient is doing well today.  Bottle feeding.  Plans for Nexplanon.  Objective: Blood pressure 101/66, pulse 73, temperature 97.6 F (36.4 C), temperature source Oral, resp. rate 18, height 5\' 7"  (1.702 m), weight 60.782 kg (134 lb), SpO2 100.00%, not currently breastfeeding.  Physical Exam:  General: alert and cooperative Lochia: appropriate Uterine Fundus: firm Incision: None DVT Evaluation: No evidence of DVT seen on physical exam.   Recent Labs  10/29/12 0625  HGB 9.7*  HCT 28.8*    Assessment/Plan: Plan for discharge tomorrow with Nexplanon as contraception.  Bottle feeding.   LOS: 1 day   [facility] 10/30/2012, 8:06 AM   Evaluation and management procedures were performed by Resident physician under my supervision/collaboration. Chart reviewed, patient examined by me and I agree with management and plan. Danae Orleans, CNM 10/30/2012 10:15 AM

## 2012-10-31 MED ORDER — IBUPROFEN 600 MG PO TABS: 600.0000 mg | ORAL_TABLET | Freq: Four times a day (QID) | ORAL | Status: DC

## 2012-10-31 MED ORDER — CYCLOBENZAPRINE HCL 10 MG PO TABS: 10.0000 mg | ORAL_TABLET | Freq: Three times a day (TID) | ORAL | Status: DC | PRN

## 2012-10-31 NOTE — Discharge Summary (Signed)
Obstetric Discharge Summary Reason for Admission: onset of labor and rupture of membranes Prenatal Procedures: ultrasound Intrapartum Procedures: spontaneous vaginal delivery Postpartum Procedures: none Complications-Operative and Postpartum: none Hemoglobin  Date Value Range Status  10/29/2012 9.7* 12.0 - 15.0 g/dL Final  81/19/1478 29.5   Final     HCT  Date Value Range Status  10/29/2012 28.8* 36.0 - 46.0 % Final  03/16/2011 37   Final   BP 112/55  Pulse 87  Temp(Src) 98.6 F (37 C) (Oral)  Resp 19  Ht 5\' 7"  (1.702 m)  Wt 60.782 kg (134 lb)  BMI 20.98 kg/m2  SpO2 100%  Breastfeeding? No  Tricia Miles was admitted via tx from [facility] due to SROM. She received 5 prenatal visits from [facility]- records obtained- however she declined tx to [facility] for delivery. Once on L&D her cx remained FT/thick, as it was at [facility] and in [facility]U. She received Cytotec PO and proceeded to SVD just under 2 hours later. She is doing well on PPD#2 and desires d/c home. She verbalizes plans to f/u for PP care at [facility]. She is bottlefeeding and is undecided re contraception. UDS was negative.  Physical Exam:  General: alert and cooperative Lochia: appropriate Uterine Fundus: firm Incision: N/A DVT Evaluation: No evidence of DVT seen on physical exam. No cords or calf tenderness. No significant calf/ankle edema.  Discharge Diagnoses: Term Pregnancy-delivered  Discharge Information: Date: 10/31/2012 Activity: pelvic rest Diet: routine Medications: PNV and Ibuprofen; pt requested Percocet- given Flexeril #30 as pain described to be in her back (did not have epid placement) Condition: stable Instructions: refer to practice specific booklet Discharge to: home Follow-up Information   Follow up with [facility] ([place]). Call in 6 weeks. (Postpartum Appointment)    Contact information:   Please call to make an appointment for 6 weeks from the date of delivery       Newborn  Data: Live born female  Birth Weight: 6 lb 14.6 oz (3135 g) APGAR: 9, 9  Home with mother.  Arther Abbott 10/31/2012, 8:02 AM  I have seen and examined this patient and I agree with the above. Cam Hai 9:28 AM 10/31/2012

## 2012-10-31 NOTE — Progress Notes (Signed)
CSW referral received to assess pt's reason for "limited PNC" however pt received adequate PNC, as per chart review.  UDS is negative, meconium screen canceled.  CSW intervention was not provided. 

## 2012-11-01 ENCOUNTER — Emergency Department: Payer: Self-pay | Admitting: Emergency Medicine

## 2012-11-01 LAB — COMPREHENSIVE METABOLIC PANEL
Albumin: 2.5 g/dL — ABNORMAL LOW (ref 3.4–5.0)
Alkaline Phosphatase: 159 U/L — ABNORMAL HIGH (ref 50–136)
BUN: 4 mg/dL — ABNORMAL LOW (ref 7–18)
Bilirubin,Total: 0.2 mg/dL (ref 0.2–1.0)
Calcium, Total: 8.6 mg/dL (ref 8.5–10.1)
EGFR (African American): >60
EGFR (Non-African Amer.): >60
Glucose: 96 mg/dL (ref 65–99)
Osmolality: 276 (ref 275–301)
Potassium: 3.6 mmol/L (ref 3.5–5.1)
SGOT(AST): 18 U/L (ref 15–37)
SGPT (ALT): 12 U/L (ref 12–78)

## 2012-11-01 LAB — URINALYSIS, COMPLETE
Bacteria: NONE SEEN
Protein: NEGATIVE
RBC,UR: 4 /HPF (ref 0–5)
Squamous Epithelial: <1
WBC UR: 9 /HPF (ref 0–5)

## 2012-11-01 LAB — CBC
HCT: 26.3 % — ABNORMAL LOW (ref 35.0–47.0)
HGB: 9 g/dL — ABNORMAL LOW (ref 12.0–16.0)
MCH: 28.7 pg (ref 26.0–34.0)
Platelet: 166 10*3/uL (ref 150–440)
RBC: 3.15 10*6/uL — ABNORMAL LOW (ref 3.80–5.20)
RDW: 15.9 % — ABNORMAL HIGH (ref 11.5–14.5)
WBC: 8.7 10*3/uL (ref 3.6–11.0)

## 2012-11-01 LAB — LIPASE, BLOOD: Lipase: 88 U/L (ref 73–393)

## 2012-11-01 LAB — HCG, QUANTITATIVE, PREGNANCY: Beta Hcg, Quant.: 341 m[IU]/mL — ABNORMAL HIGH

## 2012-12-13 ENCOUNTER — Emergency Department: Payer: Self-pay | Admitting: Emergency Medicine

## 2013-03-28 IMAGING — CR RIGHT HAND - COMPLETE 3+ VIEW
1 series · 3 of 3 positions shown · non-contrast
Comparison: none

REASON FOR EXAM: punched a wall/pain and swelling to back of hand
COMMENTS:

[Series 1: view not recorded · 0.17mm/px · 3 of 3 slices shown]
[im 1/3]
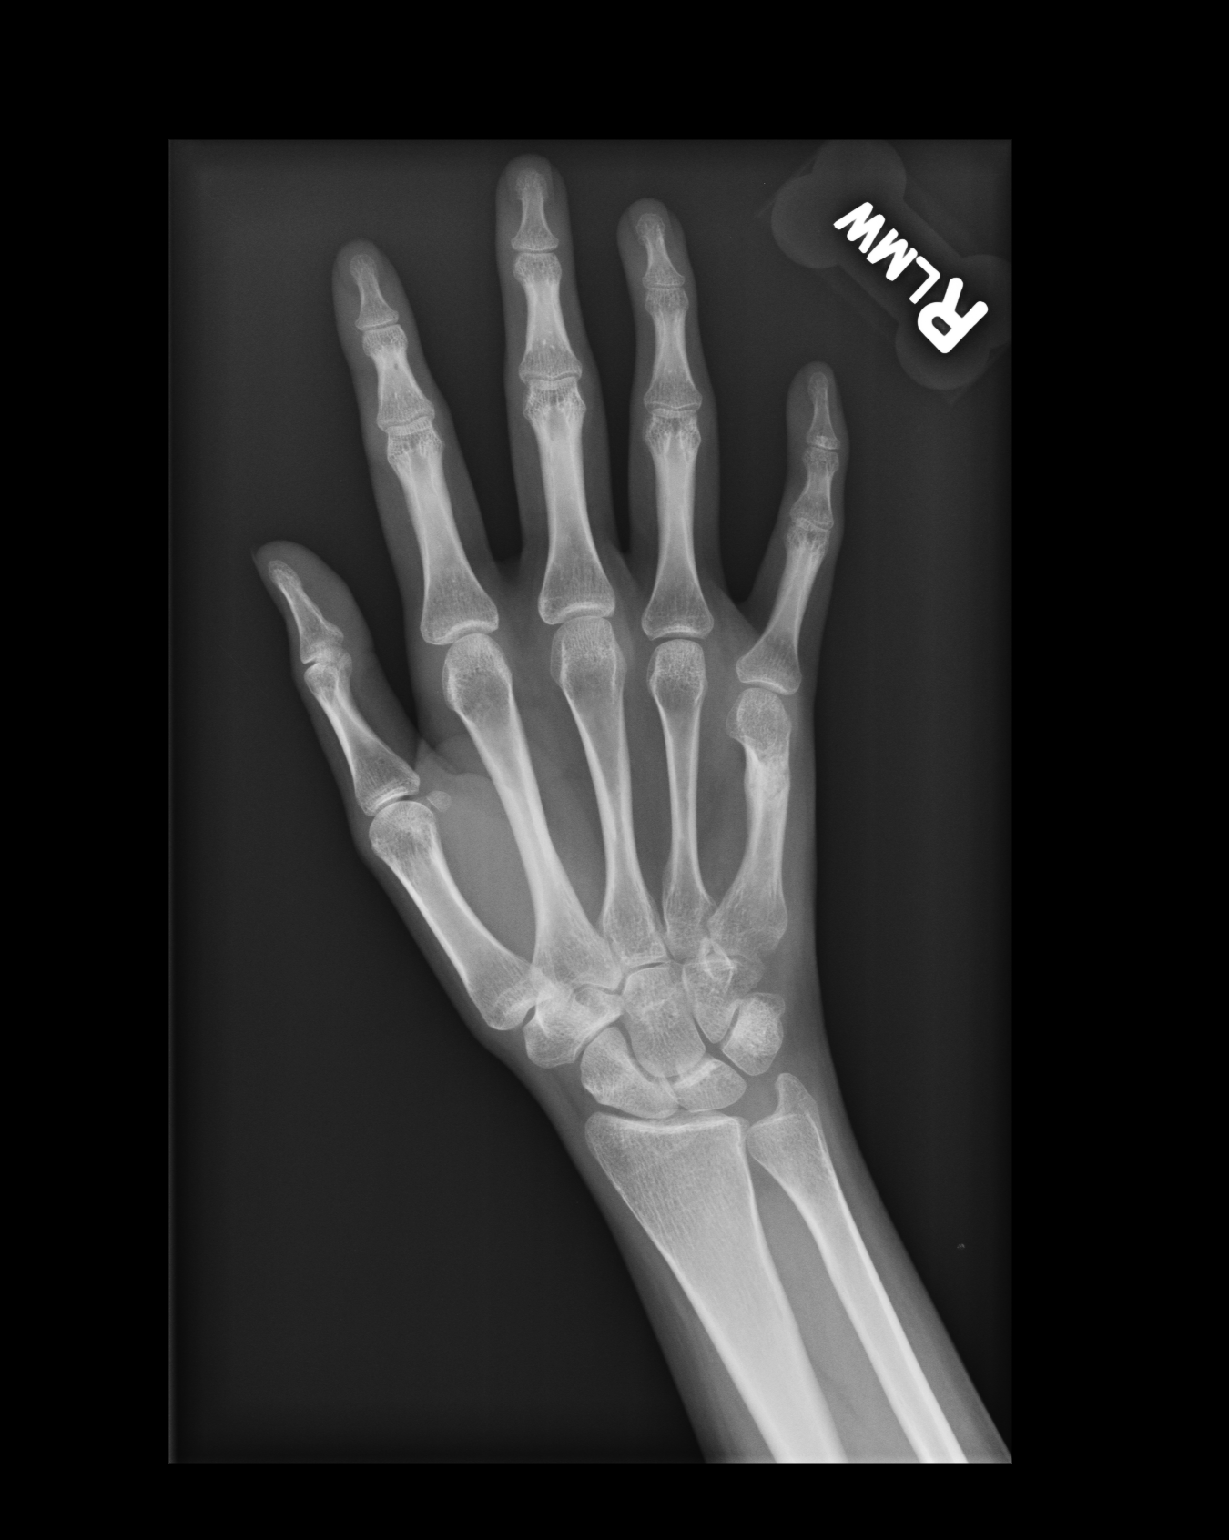
[im 2/3]
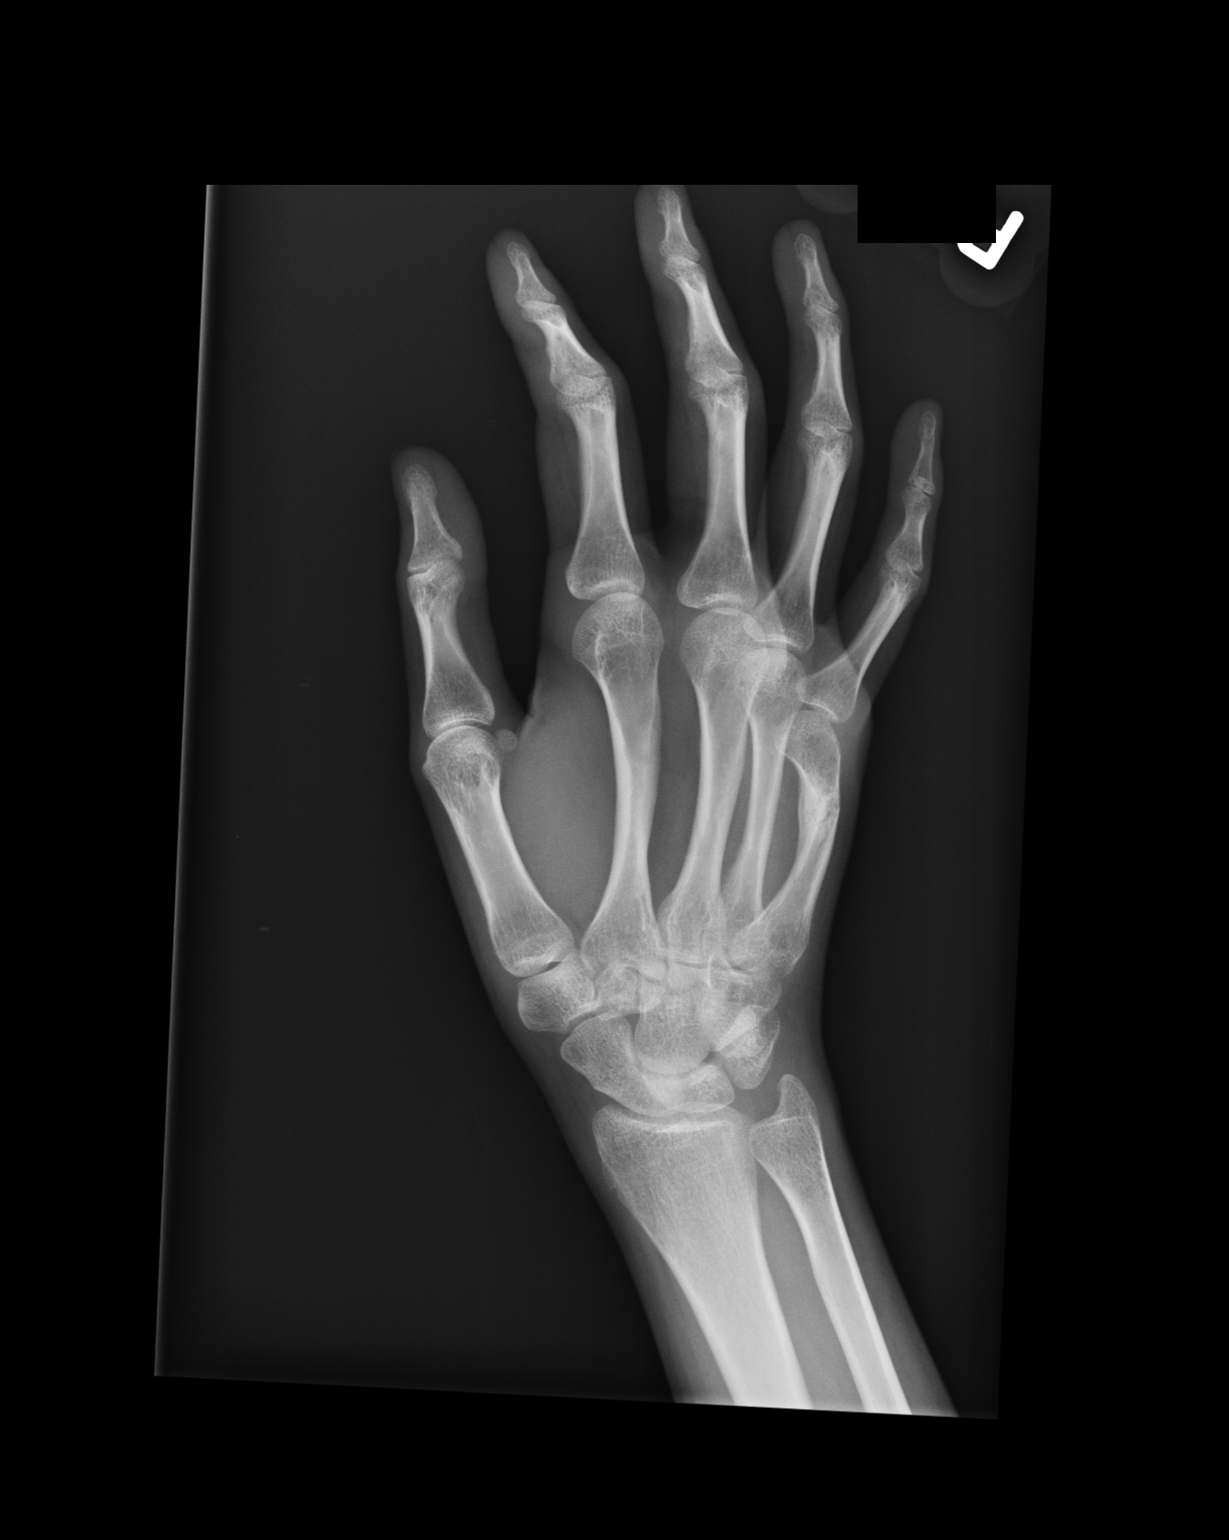
[im 3/3]
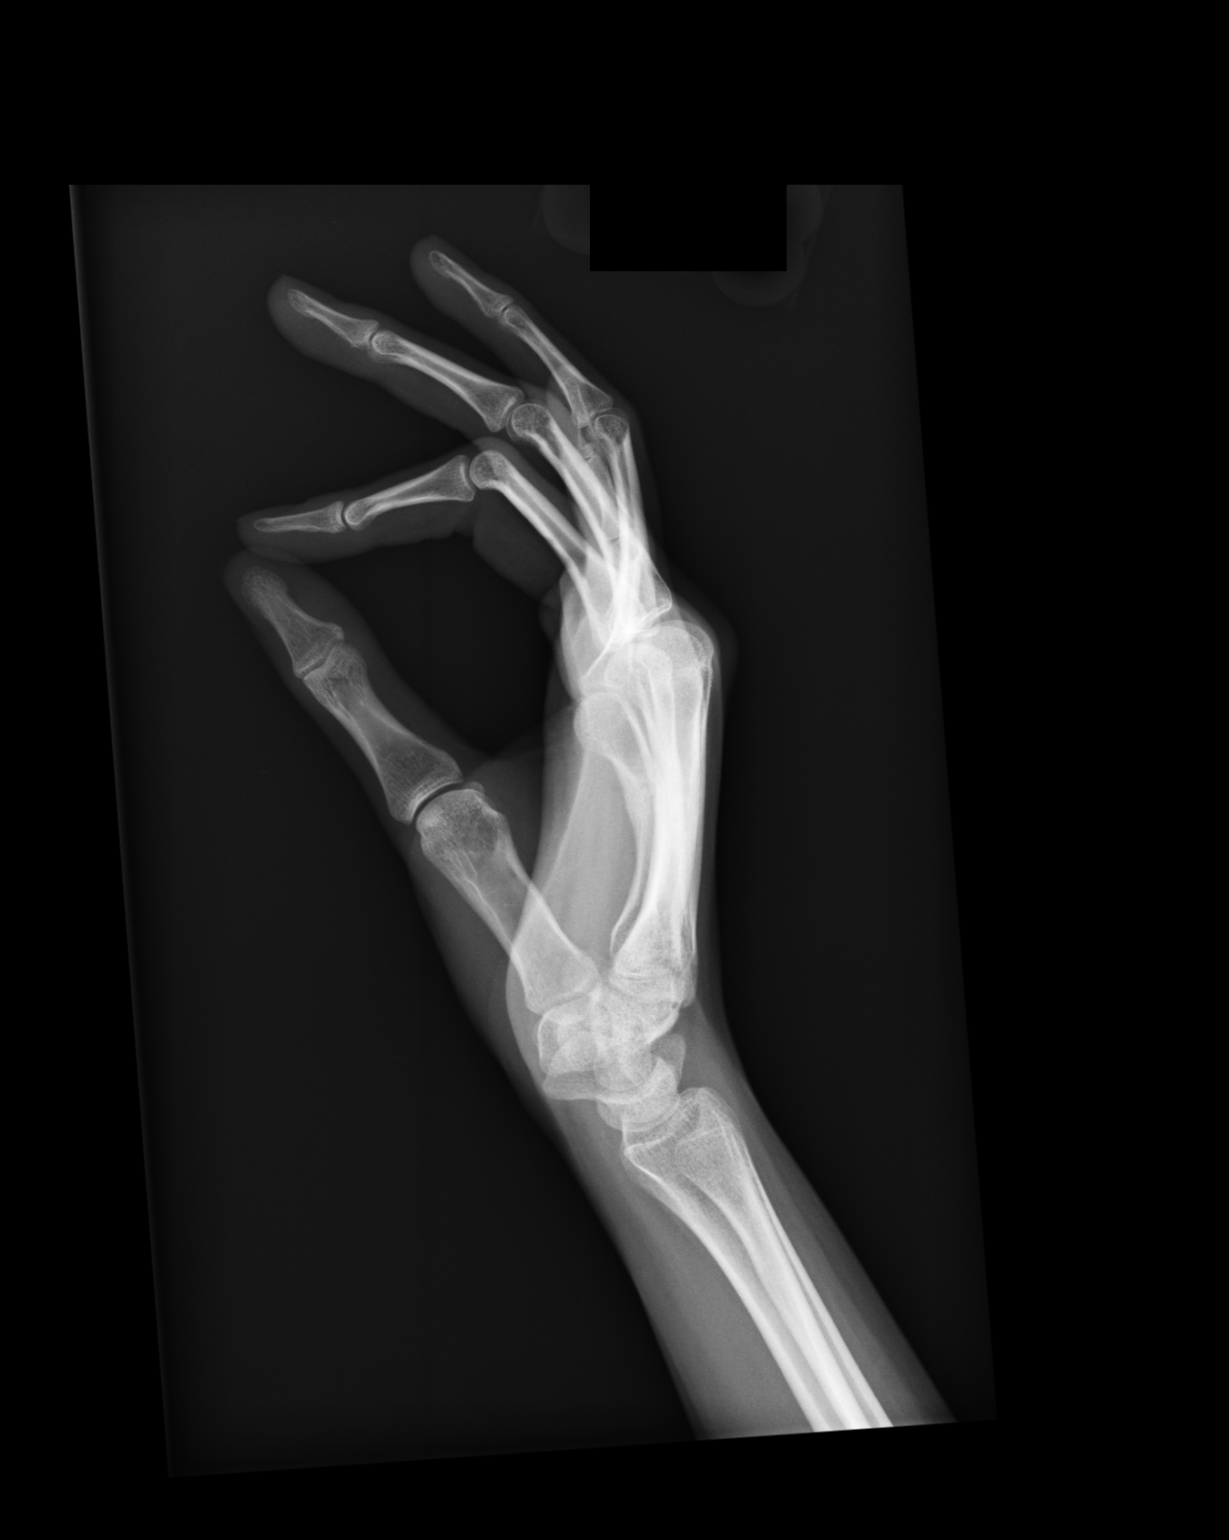

[3 of 3 positions shown; findings below may reference images not displayed]

PROCEDURE:     DXR - DXR HAND RT COMPLETE W/OBLIQUES  - February 04, 2011  [DATE]

RESULT:     Images of the right hand are compared to study of 09/27/2010.
There is again a fracture the right fifth metacarpal with evidence of
healing. No displacement or comminution is present. The bony structures
otherwise intact.
IMPRESSION: 1. Right fifth metacarpal fracture with some lucency remaining. Callus
formation is present.

## 2013-04-02 ENCOUNTER — Emergency Department: Payer: Self-pay | Admitting: Emergency Medicine

## 2013-04-16 ENCOUNTER — Emergency Department (HOSPITAL_COMMUNITY)
Admission: EM | Admit: 2013-04-16 | Discharge: 2013-04-16 | Disposition: A | Discharge status: Home / Self Care | Payer: Medicaid Other | Attending: Emergency Medicine | Admitting: Emergency Medicine

## 2013-04-16 ENCOUNTER — Encounter (HOSPITAL_COMMUNITY): Payer: Self-pay | Admitting: Emergency Medicine

## 2013-04-16 DIAGNOSIS — R6884 Jaw pain: Secondary | ICD-10-CM | POA: Insufficient documentation

## 2013-04-16 DIAGNOSIS — K0889 Other specified disorders of teeth and supporting structures: Secondary | ICD-10-CM

## 2013-04-16 DIAGNOSIS — F17200 Nicotine dependence, unspecified, uncomplicated: Secondary | ICD-10-CM | POA: Insufficient documentation

## 2013-04-16 DIAGNOSIS — K089 Disorder of teeth and supporting structures, unspecified: Secondary | ICD-10-CM | POA: Insufficient documentation

## 2013-04-16 MED ORDER — OXYCODONE-ACETAMINOPHEN 5-325 MG PO TABS: 1.0000 | ORAL_TABLET | Freq: Four times a day (QID) | ORAL | Status: AC | PRN

## 2013-04-16 MED ORDER — PENICILLIN V POTASSIUM 500 MG PO TABS: 500.0000 mg | ORAL_TABLET | Freq: Four times a day (QID) | ORAL | Status: AC

## 2013-04-16 MED ORDER — OXYCODONE-ACETAMINOPHEN 5-325 MG PO TABS
2.0000 | ORAL_TABLET | Freq: Once | ORAL | Status: AC
  Administered 2013-04-16: 2 via ORAL
  Filled 2013-04-16: qty 2

## 2013-04-16 NOTE — ED Provider Notes (Signed)
CSN: [id number]     Arrival date & time 04/16/13  2006 History  This chart was scribed for non-physician practitioner, Santiago Glad, PA-C,working with Glynn Octave, MD, by Karle Plumber, ED Scribe.  This patient was seen in room TR08C/TR08C and the patient's care was started at 10:15 PM.  Chief Complaint  Patient presents with  . Dental Pain  . Jaw Pain   The history is provided by the patient. No language interpreter was used.   HPI Comments:  Tricia Miles is a 22 y.o. female who presents to the Emergency Department complaining of worsening lower right jaw pain for approximately two weeks. She reports associated swelling starting this morning. She also reports associated pain with swallowing. Pt states the pain is a 10/10. She states she has taken OTC Tylenol and Ibuprofen with no relief. She denies drooling, trouble swallowing, fever, chills, nausea, or vomiting. She reports being a smoker. She states she has a dentist appt next week at [facility].   Past Medical History  Diagnosis Date  . No pertinent past medical history   . Medical history non-contributory   . Trichomonal vulvovaginitis    Past Surgical History  Procedure Laterality Date  . No past surgeries     Family History  Problem Relation Age of Onset  . Anesthesia problems Neg Hx    History  Substance Use Topics  . Smoking status: Current Every Day Smoker -- 0.30 packs/day    Types: Cigarettes  . Smokeless tobacco: Never Used  . Alcohol Use: No   OB History   Grav Para Term Preterm Abortions TAB SAB Ect Mult Living   2 2 2  0 0 0 0 0 0 2     Review of Systems  Constitutional: Negative for fever and chills.  HENT: Positive for dental problem and facial swelling. Negative for trouble swallowing.   Gastrointestinal: Negative for nausea and vomiting.  All other systems reviewed and are negative.    Allergies  Codeine; Tramadol; and Vicodin  Home Medications   Current Outpatient Rx   Name  Route  Sig  Dispense  Refill  . acetaminophen (TYLENOL) 325 MG tablet   Oral   Take 650 mg by mouth every 6 (six) hours as needed for mild pain.         Marland Kitchen etonogestrel (IMPLANON) 68 MG IMPL implant   Subcutaneous   Inject 1 each into the skin once.         Marland Kitchen ibuprofen (ADVIL,MOTRIN) 600 MG tablet   Oral   Take 600 mg by mouth every 6 (six) hours as needed for mild pain.          Triage Vitals: BP 110/79  Pulse 95  Temp(Src) 98.2 F (36.8 C) (Oral)  Resp 16  Ht 5\' 7"  (1.702 m)  Wt 112 lb (50.803 kg)  BMI 17.54 kg/m2  SpO2 100%  LMP 04/15/2013 Physical Exam  Nursing note and vitals reviewed. Constitutional: She is oriented to person, place, and time. She appears well-developed and well-nourished. No distress.  HENT:  Head: Normocephalic and atraumatic.  Mouth/Throat: Uvula is midline, oropharynx is clear and moist and mucous membranes are normal. No trismus in the jaw. Abnormal dentition. No dental abscesses or uvula swelling. No oropharyngeal exudate, posterior oropharyngeal edema, posterior oropharyngeal erythema or tonsillar abscesses.  Poor dental hygiene. Pt able to open and close mouth with out difficulty. Airway intact. Uvula midline. Mild gingival swelling with tenderness over affected area, but no fluctuance. No swelling or  tenderness of submental and submandibular regions.  No sublingual tenderness.  No tongue elevation.  Eyes: Conjunctivae and EOM are normal.  Neck: Normal range of motion and full passive range of motion without pain. Neck supple.  Cardiovascular: Normal rate, regular rhythm and normal heart sounds.   Pulmonary/Chest: Effort normal and breath sounds normal.  Musculoskeletal: Normal range of motion.  Lymphadenopathy:       Head (right side): No submental, no submandibular, no tonsillar, no preauricular and no posterior auricular adenopathy present.       Head (left side): No submental, no submandibular, no tonsillar, no preauricular and no  posterior auricular adenopathy present.  Neurological: She is alert and oriented to person, place, and time.  Skin: Skin is warm and dry. No rash noted. She is not diaphoretic.  Psychiatric: She has a normal mood and affect. Her behavior is normal.    ED Course  Procedures (including critical care time) DIAGNOSTIC STUDIES: Oxygen Saturation is 100% on RA, normal by my interpretation.   COORDINATION OF CARE: 10:17 PM- Will prescribe an antibiotic and pain medication. Advised pt to make sure she keep her appt with the dentist next week. Pt verbalizes understanding and agrees to plan.  Medications - No data to display  Labs Review Labs Reviewed - No data to display Imaging Review No results found.  EKG Interpretation   None       MDM  No diagnosis found. Patient with toothache.  No gross abscess.  Exam unconcerning for Ludwig's angina or spread of infection.  Will treat with penicillin and pain medicine.  Urged patient to follow-up with dentist.    I personally performed the services described in this documentation, which was scribed in my presence. The recorded information has been reviewed and is accurate.   Santiago Glad, PA-C 04/16/13 2234

## 2013-04-16 NOTE — ED Notes (Signed)
Pt. Reports worsening right lower molar pain with right jaw swelling and extensive tooth decay / dental cariesfor several days unrelieved by OTC pain medications .

## 2013-04-17 NOTE — ED Provider Notes (Signed)
Medical screening examination/treatment/procedure(s) were performed by non-physician practitioner and as supervising physician I was immediately available for consultation/collaboration.  EKG Interpretation   None         Glynn Octave, MD 04/17/13 [phone]

## 2013-05-03 ENCOUNTER — Emergency Department: Payer: Self-pay | Admitting: Emergency Medicine

## 2013-05-03 LAB — BASIC METABOLIC PANEL
Anion Gap: 4 — ABNORMAL LOW (ref 7–16)
BUN: 9 mg/dL (ref 7–18)
Calcium, Total: 9.2 mg/dL (ref 8.5–10.1)
Chloride: 107 mmol/L (ref 98–107)
Co2: 27 mmol/L (ref 21–32)
Creatinine: 0.79 mg/dL (ref 0.60–1.30)
Glucose: 87 mg/dL (ref 65–99)
Osmolality: 274 (ref 275–301)

## 2013-05-03 LAB — CBC
HGB: 12.5 g/dL (ref 12.0–16.0)
MCH: 27.3 pg (ref 26.0–34.0)
MCV: 84 fL (ref 80–100)
RBC: 4.6 10*6/uL (ref 3.80–5.20)
RDW: 14.4 % (ref 11.5–14.5)
WBC: 7.5 10*3/uL (ref 3.6–11.0)

## 2013-11-10 ENCOUNTER — Emergency Department: Payer: Self-pay | Admitting: Emergency Medicine

## 2013-11-10 LAB — URINALYSIS, COMPLETE
BACTERIA: NONE SEEN
BILIRUBIN, UR: NEGATIVE
Glucose,UR: NEGATIVE mg/dL (ref 0–75)
KETONE: NEGATIVE
Leukocyte Esterase: NEGATIVE
Nitrite: NEGATIVE
PH: 7 (ref 4.5–8.0)
Protein: NEGATIVE
RBC,UR: 164 /HPF (ref 0–5)
Specific Gravity: 1.005 (ref 1.003–1.030)
Squamous Epithelial: <1

## 2013-11-10 LAB — PREGNANCY, URINE: PREGNANCY TEST, URINE: NEGATIVE m[IU]/mL

## 2014-03-17 ENCOUNTER — Encounter (HOSPITAL_COMMUNITY): Payer: Self-pay | Admitting: Emergency Medicine

## 2014-09-23 NOTE — H&P (Signed)
L&D Evaluation:  History:  HPI 24 year old G2P1001 with EDC=11/05/2012 based on LMP=01/30/2012 who presented at 6523 1/7 weeks after having an episode of spotting last night. She had bumped her lower left abdomen on the toliet when she was bending down to vomit. Patient works nights at the [employer] and had eaten some chicken after which she became nauseous and vomited several times. She is no longer nauseated. She noted blood on the toliet tissue when wiping x1 and had some cramping earlier, but currently denies pain, cramping, or further spotting. Denies dysuria or hematuria. +FM. PNC noteable for late entry at 18 weeks and close interconceptual spacing. Last delivery in June 2013. She has been treated for Trichimonas this pregnancy. Denies vulvar itching or irritation now.   Presents with vaginal spotting   Patient's Medical History No Chronic Illness   Patient's Surgical History none   Medications Pre Natal Vitamins   Allergies NKDA   Social History Former smoker   Family History Non-Contributory   ROS:  ROS see HPI   Exam:  Vital Signs stable  122/56   Urine Protein not completed   General no apparent distress, sleeping   Mental Status clear   Abdomen gravid, non-tender, no bruising seen   Fetal Position cephalic   Pelvic no external lesions, Wet prep: negative hyphae, positive clue cells, neg Trich.  No blood in vagina, cx nonfriable and appears closed   Mebranes Intact   FHT 145   Ucx absent   Skin dry   Other On US placenta is posterior and ?low lying   Impression:  Impression IUP at 23 1/7 weeks s/p vaginal spotting episode. Bacterial vaginosis   Plan:  Plan DC home. RX Flagyl 500 mgm BID x 7 days Will schedule for a ultrasound for  placental location and ROB (no showed for ROB yesterday)   Electronic Signatures: Trinna BalloonGutierrez, Kiaraliz Rafuse L (CNM)  (Signed 25-Feb-14 08:01)  Authored: L&D Evaluation   Last Updated: 25-Feb-14 08:01 by Trinna BalloonGutierrez, Laren Whaling L  (CNM)

## 2014-09-23 NOTE — H&P (Signed)
L&D Evaluation:  History:   HPI 24 [facility] presenting with complaints of LOF.  States earlier yesterday evening patient had gush of fluid.  Since has had no further leakage.  Denies VB or ctx.  Reports +FM    Presents with leaking fluid    Patient's Medical History No Chronic Illness    Patient's Surgical History none    Medications Pre Natal Vitamins    Allergies tramadol, vicodin    Social History none     Family History FOB with family history of heart defects    ROS:   ROS All systems were reviewed.  HEENT, CNS, GI, GU, Respiratory, CV, Renal and Musculoskeletal systems were found to be normal.   Exam:   Vital Signs stable     General no apparent distress    Mental Status clear     Abdomen gravid, non-tender    Estimated Fetal Weight Average for gestational age    Fetal Position vtx    Edema no edema     Pelvic no external lesions, cervix closed FTP at os, posterior. vtx out of pelvis    Mebranes Intact, negative ferning, nitrazine, and pooling on exam    FHT normal rate with no decels    Ucx absent   Impression:   Impression R/o rupture of membranes   Plan:   Plan EFM/NST    Comments 1) R/O SROM - no evidence of SROM on exam.  May have been urine 2) Fetus - RFS, category I tracing 3) Disposition - home with routine labor precautions, has follow up in 7 days   Electronic Signatures: Lorrene ReidStaebler, Shavy Beachem M (MD)  (Signed 01-Jun-13 01:32)  Willis ModenaAuthored: L&D Evaluation   Last Updated: 01-Jun-13 01:32 by Lorrene ReidStaebler, Zade Falkner M (MD)

## 2014-09-23 NOTE — H&P (Signed)
L&D Evaluation:  History:  HPI [age] year old G2P1001 with EDC=11/05/2012 based on LMP=01/30/2012 who presented at 7435 2/7 week after loosing her mucous plug.  No other LOF, no VB, no ctx, good FM.   Presents with loss of mucous plug   Patient's Medical History No Chronic Illness   Patient's Surgical History none   Medications Pre Natal Vitamins   Allergies NKDA   Social History Former smoker   Family History Non-Contributory   ROS:  ROS see HPI   Exam:  Vital Signs stable  122/56   Urine Protein not completed   General no apparent distress, sleeping   Mental Status clear   Abdomen gravid, non-tender, no bruising seen   Fetal Position cephalic   Pelvic no external lesions, nitrazine negative   Mebranes Intact   FHT normal rate with no decels   Ucx absent   Skin dry   Impression:  Impression IUP at 35 2/7 weeks with loss of mucous plug   Plan:  Plan EFM/NST   Comments - nitrazine negative - No ctx - category I tracing - Discharge home follow up in place next week   Electronic Signatures: Lorrene ReidStaebler, Sierra Spargo M (MD)  (Signed 21-May-14 15:03)  Authored: L&D Evaluation   Last Updated: 21-May-14 15:03 by Lorrene ReidStaebler, Madie Cahn M (MD)

## 2014-12-30 ENCOUNTER — Emergency Department
Admission: EM | Admit: 2014-12-30 | Discharge: 2014-12-30 | Disposition: A | Discharge status: Home / Self Care | Payer: Medicaid Other | Attending: Emergency Medicine | Admitting: Emergency Medicine

## 2014-12-30 ENCOUNTER — Encounter: Payer: Self-pay | Admitting: Emergency Medicine

## 2014-12-30 DIAGNOSIS — K029 Dental caries, unspecified: Secondary | ICD-10-CM | POA: Insufficient documentation

## 2014-12-30 DIAGNOSIS — K088 Other specified disorders of teeth and supporting structures: Secondary | ICD-10-CM | POA: Diagnosis present

## 2014-12-30 DIAGNOSIS — Z720 Tobacco use: Secondary | ICD-10-CM | POA: Diagnosis not present

## 2014-12-30 DIAGNOSIS — K0381 Cracked tooth: Secondary | ICD-10-CM | POA: Insufficient documentation

## 2014-12-30 DIAGNOSIS — K0889 Other specified disorders of teeth and supporting structures: Secondary | ICD-10-CM

## 2014-12-30 MED ORDER — PENICILLIN V POTASSIUM 500 MG PO TABS: 500.0000 mg | ORAL_TABLET | Freq: Four times a day (QID) | ORAL | Status: DC

## 2014-12-30 MED ORDER — PENICILLIN V POTASSIUM 250 MG PO TABS
500.0000 mg | ORAL_TABLET | Freq: Once | ORAL | Status: AC
  Administered 2014-12-30: 500 mg via ORAL
  Filled 2014-12-30: qty 2

## 2014-12-30 MED ORDER — IBUPROFEN 600 MG PO TABS: 600.0000 mg | ORAL_TABLET | Freq: Three times a day (TID) | ORAL | Status: DC | PRN

## 2014-12-30 MED ORDER — IBUPROFEN 600 MG PO TABS
600.0000 mg | ORAL_TABLET | Freq: Once | ORAL | Status: AC
  Administered 2014-12-30: 600 mg via ORAL
  Filled 2014-12-30: qty 1

## 2014-12-30 MED ORDER — LIDOCAINE VISCOUS 2 % MT SOLN
15.0000 mL | Freq: Once | OROMUCOSAL | Status: AC
  Administered 2014-12-30: 15 mL via OROMUCOSAL
  Filled 2014-12-30: qty 15

## 2014-12-30 NOTE — ED Notes (Signed)
Patient with no complaints at this time. Respirations even and unlabored. Skin warm/dry. Discharge instructions reviewed with patient at this time. Patient given opportunity to voice concerns/ask questions. Patient discharged at this time and left Emergency Department with steady gait.   

## 2014-12-30 NOTE — ED Notes (Signed)
Patient ambulatory to triage with steady gait, without difficulty or distress noted; pt reports left lower dental pain tonight

## 2014-12-30 NOTE — ED Provider Notes (Signed)
[facility] Emergency Department Provider Note  ____________________________________________  Time seen: Approximately 6:00 AM  I have reviewed the triage vital signs and the nursing notes.   HISTORY  Chief Complaint Dental Pain    HPI Tricia Miles is a 24 y.o. female who presents to the ED from home with a chief complaint of left lower toothache which began earlier this evening. Denies injury/trauma. Complains of 10/10 throbbing pain from left lower molar radiating to jaw.Patient denies history of diabetes.   Past Medical History  Diagnosis Date  . No pertinent past medical history   . Medical history non-contributory   . Trichomonal vulvovaginitis     There are no active problems to display for this patient.   Past Surgical History  Procedure Laterality Date  . No past surgeries      Current Outpatient Rx  Name  Route  Sig  Dispense  Refill  . acetaminophen (TYLENOL) 325 MG tablet   Oral   Take 650 mg by mouth every 6 (six) hours as needed for mild pain.         Marland Kitchen etonogestrel (IMPLANON) 68 MG IMPL implant   Subcutaneous   Inject 1 each into the skin once.         Marland Kitchen ibuprofen (ADVIL,MOTRIN) 600 MG tablet   Oral   Take 600 mg by mouth every 6 (six) hours as needed for mild pain.         Marland Kitchen oxyCODONE-acetaminophen (PERCOCET/ROXICET) 5-325 MG per tablet   Oral   Take 1-2 tablets by mouth every 6 (six) hours as needed for severe pain.   15 tablet   0     Allergies Codeine; Tramadol; and Vicodin  Family History  Problem Relation Age of Onset  . Anesthesia problems Neg Hx     Social History Social History  Substance Use Topics  . Smoking status: Current Every Day Smoker -- 0.30 packs/day    Types: Cigarettes  . Smokeless tobacco: Never Used  . Alcohol Use: No    Review of Systems Constitutional: No fever/chills Eyes: No visual changes. ENT: Positive for toothache. No sore throat. Cardiovascular: Denies chest  pain. Respiratory: Denies shortness of breath. Gastrointestinal: No abdominal pain.  No nausea, no vomiting.  No diarrhea.  No constipation. Genitourinary: Negative for dysuria. Musculoskeletal: Negative for back pain. Skin: Negative for rash. Neurological: Negative for headaches, focal weakness or numbness.  10-point ROS otherwise negative.  ____________________________________________   PHYSICAL EXAM:  VITAL SIGNS: ED Triage Vitals  Enc Vitals Group     BP 12/30/14 0123 112/68 mmHg     Pulse Rate 12/30/14 0123 90     Resp 12/30/14 0123 20     Temp 12/30/14 0123 98.1 F (36.7 C)     Temp Source 12/30/14 0123 Oral     SpO2 12/30/14 0123 100 %     Weight 12/30/14 0123 105 lb (47.628 kg)     Height 12/30/14 0123 5\' 7"  (1.702 m)     Head Cir --      Peak Flow --      Pain Score 12/30/14 0124 10     Pain Loc --      Pain Edu? --      Excl. in GC? --     Constitutional: Alert and oriented. Well appearing and in no acute distress. Eyes: Conjunctivae are normal. PERRL. EOMI. Head: Atraumatic. Nose: No congestion/rhinnorhea. Mouth/Throat: Mucous membranes are moist.  Oropharynx non-erythematous. Poor dentition. There are multiple  broken teeth with widespread dental caries. There is no abscess noted. There is no facial swelling. Left lower back molar is tender to palpation with tongue blade. Neck: No stridor.   Cardiovascular: Normal rate, regular rhythm. Grossly normal heart sounds.  Good peripheral circulation. Respiratory: Normal respiratory effort.  No retractions. Lungs CTAB. Gastrointestinal: Soft and nontender. No distention. No abdominal bruits. No CVA tenderness. Musculoskeletal: No lower extremity tenderness nor edema.  No joint effusions. Neurologic:  Normal speech and language. No gross focal neurologic deficits are appreciated. No gait instability. Skin:  Skin is warm, dry and intact. No rash noted. Psychiatric: Mood and affect are normal. Speech and behavior are  normal.  ____________________________________________   LABS (all labs ordered are listed, but only abnormal results are displayed)  Labs Reviewed - No data to display ____________________________________________  EKG  None ____________________________________________  RADIOLOGY  None ____________________________________________   PROCEDURES  Procedure(s) performed: None  Critical Care performed: No  ____________________________________________   INITIAL IMPRESSION / ASSESSMENT AND PLAN / ED COURSE  Pertinent labs & imaging results that were available during my care of the patient were reviewed by me and considered in my medical decision making (see chart for details).  24 year old female with dentalgia secondary to widespread dental caries. Will initiate antibiotic treatment, analgesia and dental follow-up. Strict return precautions given. Patient verbalizes understanding and agrees with plan of care. ____________________________________________   FINAL CLINICAL IMPRESSION(S) / ED DIAGNOSES  Final diagnoses:  Dentalgia  Dental caries      Irean Hong, MD 12/30/14 [phone]

## 2014-12-30 NOTE — Discharge Instructions (Signed)
1. Take antibiotic as prescribed (Penicillin VK 500mg  four times daily x 7 days). 2. Take pain medicines as needed (Motrin #15). 3. Return to the ER for worsening symptoms, persistent vomiting, fever, difficulty breathing or other concerns.   Dental Pain A tooth ache may be caused by cavities (tooth decay). Cavities expose the nerve of the tooth to air and hot or cold temperatures. It may come from an infection or abscess (also called a boil or furuncle) around your tooth. It is also often caused by dental caries (tooth decay). This causes the pain you are having. DIAGNOSIS  Your caregiver can diagnose this problem by exam. TREATMENT   If caused by an infection, it may be treated with medications which kill germs (antibiotics) and pain medications as prescribed by your caregiver. Take medications as directed.  Only take over-the-counter or prescription medicines for pain, discomfort, or fever as directed by your caregiver.  Whether the tooth ache today is caused by infection or dental disease, you should see your dentist as soon as possible for further care. SEEK MEDICAL CARE IF: The exam and treatment you received today has been provided on an emergency basis only. This is not a substitute for complete medical or dental care. If your problem worsens or new problems (symptoms) appear, and you are unable to meet with your dentist, call or return to this location. SEEK IMMEDIATE MEDICAL CARE IF:   You have a fever.  You develop redness and swelling of your face, jaw, or neck.  You are unable to open your mouth.  You have severe pain uncontrolled by pain medicine. MAKE SURE YOU:   Understand these instructions.  Will watch your condition.  Will get help right away if you are not doing well or get worse. Document Released: 05/02/2005 Document Revised: 07/25/2011 Document Reviewed: 12/19/2007 [facility] Patient Information 2015 [place], [place]. This information is not intended to replace  advice given to you by your health care provider. Make sure you discuss any questions you have with your health care provider.  Dental Caries Dental caries (also called tooth decay) is the most common oral disease. It can occur at any age but is more common in children and young adults.  HOW DENTAL CARIES DEVELOPS  The process of decay begins when bacteria and foods (particularly sugars and starches) combine in your mouth to produce plaque. Plaque is a substance that sticks to the hard, outer surface of a tooth (enamel). The bacteria in plaque produce acids that attack enamel. These acids may also attack the root surface of a tooth (cementum) if it is exposed. Repeated attacks dissolve these surfaces and create holes in the tooth (cavities). If left untreated, the acids destroy the other layers of the tooth.  RISK FACTORS  Frequent sipping of sugary beverages.   Frequent snacking on sugary and starchy foods, especially those that easily get stuck in the teeth.   Poor oral hygiene.   Dry mouth.   Substance abuse such as methamphetamine abuse.   Broken or poor-fitting dental restorations.   Eating disorders.   Gastroesophageal reflux disease (GERD).   Certain radiation treatments to the head and neck. SYMPTOMS In the early stages of dental caries, symptoms are seldom present. Sometimes white, chalky areas may be seen on the enamel or other tooth layers. In later stages, symptoms may include:  Pits and holes on the enamel.  Toothache after sweet, hot, or cold foods or drinks are consumed.  Pain around the tooth.  Swelling around the  tooth. DIAGNOSIS  Most of the time, dental caries is detected during a regular dental checkup. A diagnosis is made after a thorough medical and dental history is taken and the surfaces of your teeth are checked for signs of dental caries. Sometimes special instruments, such as lasers, are used to check for dental caries. Dental X-ray exams may be  taken so that areas not visible to the eye (such as between the contact areas of the teeth) can be checked for cavities.  TREATMENT  If dental caries is in its early stages, it may be reversed with a fluoride treatment or an application of a remineralizing agent at the dental office. Thorough brushing and flossing at home is needed to aid these treatments. If it is in its later stages, treatment depends on the location and extent of tooth destruction:   If a small area of the tooth has been destroyed, the destroyed area will be removed and cavities will be filled with a material such as gold, silver amalgam, or composite resin.   If a large area of the tooth has been destroyed, the destroyed area will be removed and a cap (crown) will be fitted over the remaining tooth structure.   If the center part of the tooth (pulp) is affected, a procedure called a root canal will be needed before a filling or crown can be placed.   If most of the tooth has been destroyed, the tooth may need to be pulled (extracted). HOME CARE INSTRUCTIONS You can prevent, stop, or reverse dental caries at home by practicing good oral hygiene. Good oral hygiene includes:  Thoroughly cleaning your teeth at least twice a day with a toothbrush and dental floss.   Using a fluoride toothpaste. A fluoride mouth rinse may also be used if recommended by your dentist or health care provider.   Restricting the amount of sugary and starchy foods and sugary liquids you consume.   Avoiding frequent snacking on these foods and sipping of these liquids.   Keeping regular visits with a dentist for checkups and cleanings. PREVENTION   Practice good oral hygiene.  Consider a dental sealant. A dental sealant is a coating material that is applied by your dentist to the pits and grooves of teeth. The sealant prevents food from being trapped in them. It may protect the teeth for several years.  Ask about fluoride supplements if  you live in a community without fluorinated water or with water that has a low fluoride content. Use fluoride supplements as directed by your dentist or health care provider.  Allow fluoride varnish applications to teeth if directed by your dentist or health care provider. Document Released: 01/22/2002 Document Revised: 09/16/2013 Document Reviewed: 05/04/2012 [facility] Patient Information 2015 [place], [place]. This information is not intended to replace advice given to you by your health care provider. Make sure you discuss any questions you have with your health care provider.

## 2015-04-01 ENCOUNTER — Emergency Department (HOSPITAL_COMMUNITY)
Admission: EM | Admit: 2015-04-01 | Discharge: 2015-04-01 | Disposition: A | Discharge status: Home / Self Care | Payer: Medicaid Other | Attending: Emergency Medicine | Admitting: Emergency Medicine

## 2015-04-01 ENCOUNTER — Encounter (HOSPITAL_COMMUNITY): Payer: Self-pay | Admitting: Emergency Medicine

## 2015-04-01 DIAGNOSIS — Z792 Long term (current) use of antibiotics: Secondary | ICD-10-CM | POA: Insufficient documentation

## 2015-04-01 DIAGNOSIS — Z8619 Personal history of other infectious and parasitic diseases: Secondary | ICD-10-CM | POA: Diagnosis not present

## 2015-04-01 DIAGNOSIS — F17210 Nicotine dependence, cigarettes, uncomplicated: Secondary | ICD-10-CM | POA: Diagnosis not present

## 2015-04-01 DIAGNOSIS — K029 Dental caries, unspecified: Secondary | ICD-10-CM | POA: Insufficient documentation

## 2015-04-01 DIAGNOSIS — K0889 Other specified disorders of teeth and supporting structures: Secondary | ICD-10-CM | POA: Diagnosis not present

## 2015-04-01 MED ORDER — BUPIVACAINE-EPINEPHRINE (PF) 0.5% -1:200000 IJ SOLN
1.8000 mL | Freq: Once | INTRAMUSCULAR | Status: AC
  Administered 2015-04-01: 1.8 mL
  Filled 2015-04-01: qty 1.8

## 2015-04-01 MED ORDER — IBUPROFEN 800 MG PO TABS: 800.0000 mg | ORAL_TABLET | Freq: Three times a day (TID) | ORAL | Status: DC

## 2015-04-01 MED ORDER — PENICILLIN V POTASSIUM 500 MG PO TABS: 500.0000 mg | ORAL_TABLET | Freq: Two times a day (BID) | ORAL | Status: AC

## 2015-04-01 NOTE — ED Provider Notes (Signed)
CSN: [id number]     Arrival date & time 04/01/15  1253 History  By signing my name below, I, Tricia Miles, attest that this documentation has been prepared under the direction and in the presence of Melburn Hake, [place].  Electronically Signed: Gwenyth Miles, ED Scribe. 04/01/2015. 2:20 PM.   Chief Complaint  Patient presents with  . Dental Pain   The history is provided by the patient. No language interpreter was used.   HPI Comments: Tricia Miles is a 24 y.o. female who presents to the Emergency Department complaining of constant, moderate left lower dental pain that started several months ago and became worse 2 weeks ago. Pt reports left-sided facial swelling as an associated symptom. She tried applying ice, Oragel and Excedrin with no relief. Pt was last evaluated by a dentist 1.5 years ago and had some teeth removed, but does not have a Education officer, community in [place]. She was last seen in the ED on 8/16 for left lower dental pain and was prescribed Penicillin. She did not follow-up with a dentist after that visit. Pt denies fever, neck swelling, difficulty breathing, difficulty swallowing or purulent drainage.  Past Medical History  Diagnosis Date  . No pertinent past medical history   . Medical history non-contributory   . Trichomonal vulvovaginitis    Past Surgical History  Procedure Laterality Date  . No past surgeries     Family History  Problem Relation Age of Onset  . Anesthesia problems Neg Hx    Social History  Substance Use Topics  . Smoking status: Current Every Day Smoker -- 0.30 packs/day    Types: Cigarettes  . Smokeless tobacco: Never Used  . Alcohol Use: No   OB History    Gravida Para Term Preterm AB TAB SAB Ectopic Multiple Living   0 0 0 0 0 0 2     Review of Systems  Constitutional: Negative for fever.  HENT: Positive for dental problem and facial swelling. Negative for trouble swallowing.   All other systems reviewed and are  negative.  Allergies  Codeine; Tramadol; and Vicodin  Home Medications   Prior to Admission medications   Medication Sig Start Date End Date Taking? Authorizing Provider  acetaminophen (TYLENOL) 325 MG tablet Take 650 mg by mouth every 6 (six) hours as needed for mild pain.    Historical Provider, MD  etonogestrel (IMPLANON) 68 MG IMPL implant Inject 1 each into the skin once.    Historical Provider, MD  ibuprofen (ADVIL,MOTRIN) 600 MG tablet Take 1 tablet (600 mg total) by mouth every 8 (eight) hours as needed. 12/30/14   Irean Hong, MD  oxyCODONE-acetaminophen (PERCOCET/ROXICET) 5-325 MG per tablet Take 1-2 tablets by mouth every 6 (six) hours as needed for severe pain. 04/16/13   Heather Laisure, PA-C  penicillin v potassium (VEETID) 500 MG tablet Take 1 tablet (500 mg total) by mouth 4 (four) times daily. 12/30/14   Irean Hong, MD   BP 122/94 mmHg  Pulse 90  Temp(Src) 98.6 F (37 C) (Oral)  Resp 18  Ht  (1.676 m)  Wt 108 lb (48.988 kg)  BMI 17.44 kg/m2  SpO2 100%  LMP 03/18/2015 Physical Exam  Constitutional: She appears well-developed and well-nourished. No distress.  HENT:  Head: Normocephalic and atraumatic.  Mouth/Throat: Dental caries present. No dental abscesses.    No signs of abscess Poor dentition Multiple dental caries Multiple pulled right lower molars  Eyes: Conjunctivae and EOM are normal.  Neck:  Normal range of motion. Neck supple. No tracheal deviation present.  No facial or neck swelling  Cardiovascular: Normal rate.   Pulmonary/Chest: Effort normal. No stridor. No respiratory distress.  Lymphadenopathy:    She has no cervical adenopathy.  Skin: Skin is warm and dry.  Psychiatric: She has a normal mood and affect. Her behavior is normal.  Nursing note and vitals reviewed.   ED Course  Procedures  DIAGNOSTIC STUDIES: Oxygen Saturation is 100% on RA, normal by my interpretation.    COORDINATION OF CARE: 2:22 PM Discussed treatment plan with  pt which includes follow-up with a dentist, a dental block and Ibuprofen-800 mg. Will provide dental resources. Pt agreed to plan.  2:31 PM Dental Block Consent: Verbal consent obtained. Risks and benefits: risks, benefits and alternatives were discussed Patient identity confirmed: provided demographic data Time out performed prior to procedure Numbed gingiva surrounding affected molars with 20% benzocaine.  Administered 1 mL of Marcaine w/ EPI.  Pt tolerated procedure well without complication.  Labs Review Labs Reviewed - No data to display  Imaging Review No results found.  Filed Vitals:   04/01/15 1442  BP: 122/84  Pulse: 85  Temp:   Resp: 18     MDM  Patient with toothache.  No gross abscess.  Exam unconcerning for Ludwig's angina or spread of infection. Dental block performed without any complications. Will treat with penicillin and pain medicine and d/c home. Urged patient to follow-up with dentist. Provided dental resources.   Final diagnoses:  Pain, dental    I personally performed the services described in this documentation, which was scribed in my presence. The recorded information has been reviewed and is accurate.    Satira Sarkicole Elizabeth [place], [place]-C 04/01/15 1447  Tricia LovelessScott Goldston, MD 04/01/15 [phone]

## 2015-04-01 NOTE — ED Notes (Signed)
C/o dental pain bottom left jaw X2 weeks, no other complaints, NAD

## 2015-04-01 NOTE — Discharge Instructions (Signed)
Take your prescriptions as prescribed. You may also continue applying ice for 15-20 minutes 3-4 times daily for pain relief. Follow-up with a dentist from the resources provided in the ED in the next few days. Return to the emergency department if symptoms worsen or new onset of fever, facial/neck swelling, difficulty breathing, unable to swallow, drainage.  [facility] [employer] [facility] [address] [place], [place] [address] Phone [phone]  [facility] [employer] [facility] [place] exemplifies the [employer] vision to improve the health and quality of life of all [place] by Public house managercreating leaders with a passion to care for the underserved and by leading the nation in community-based, service learning oral health education.  We are committed to offering comprehensive general dental services for adults, children and special needs patients in a safe, caring and professional setting.   Appointments: Our clinic is open Monday through Friday 8:00 a.m. until 5:00 p.m. The amount of time scheduled for an appointment depends on the patients specific needs. We ask that you keep your appointed time for care or provide 24-hour notice of all appointment changes. Parents or legal guardians must accompany minor children.   Payment for Services: Medicaid and other insurance plans are welcome. Payment for services is due when services are rendered and may be made by cash or credit card. If you have dental insurance, we will assist you with your claim submission.    Emergencies:  Emergency services will be provided Monday through Friday on a walk-in basis.  Please arrive early for emergency services. After hours emergency services will be provided for patients of record as required.   Services:  Armed forces operational officerComprehensive General Dentistry Childrens  Dentistry Oral Surgery - Extractions Root Canals Sealants and Tooth Colored Fillings Crowns and Bridges Dentures and Partial Dentures Implant Services Periodontal Services and Cleanings Cosmetic Armed forces operational officerTooth Whitening Digital Radiography 3-D/[facility]

## 2015-05-18 ENCOUNTER — Encounter: Payer: Self-pay | Admitting: Emergency Medicine

## 2015-05-18 ENCOUNTER — Emergency Department
Admission: EM | Admit: 2015-05-18 | Discharge: 2015-05-18 | Disposition: A | Discharge status: Home / Self Care | Payer: Medicaid Other | Attending: Emergency Medicine | Admitting: Emergency Medicine

## 2015-05-18 DIAGNOSIS — Z79899 Other long term (current) drug therapy: Secondary | ICD-10-CM | POA: Diagnosis not present

## 2015-05-18 DIAGNOSIS — H01116 Allergic dermatitis of left eye, unspecified eyelid: Secondary | ICD-10-CM

## 2015-05-18 DIAGNOSIS — H01114 Allergic dermatitis of left upper eyelid: Secondary | ICD-10-CM | POA: Insufficient documentation

## 2015-05-18 DIAGNOSIS — Z791 Long term (current) use of non-steroidal anti-inflammatories (NSAID): Secondary | ICD-10-CM | POA: Insufficient documentation

## 2015-05-18 DIAGNOSIS — H578 Other specified disorders of eye and adnexa: Secondary | ICD-10-CM | POA: Diagnosis present

## 2015-05-18 DIAGNOSIS — L299 Pruritus, unspecified: Secondary | ICD-10-CM

## 2015-05-18 DIAGNOSIS — F17210 Nicotine dependence, cigarettes, uncomplicated: Secondary | ICD-10-CM | POA: Diagnosis not present

## 2015-05-18 DIAGNOSIS — R21 Rash and other nonspecific skin eruption: Secondary | ICD-10-CM | POA: Diagnosis not present

## 2015-05-18 MED ORDER — DEXAMETHASONE SODIUM PHOSPHATE 10 MG/ML IJ SOLN
10.0000 mg | Freq: Once | INTRAMUSCULAR | Status: AC
  Administered 2015-05-18: 10 mg via INTRAMUSCULAR
  Filled 2015-05-18: qty 1

## 2015-05-18 MED ORDER — DIPHENHYDRAMINE HCL 25 MG PO CAPS
50.0000 mg | ORAL_CAPSULE | Freq: Once | ORAL | Status: AC
  Administered 2015-05-18: 50 mg via ORAL
  Filled 2015-05-18: qty 2

## 2015-05-18 MED ORDER — PREDNISONE 10 MG PO TABS: ORAL_TABLET | ORAL | Status: AC

## 2015-05-18 NOTE — ED Provider Notes (Signed)
[facility] Emergency Department Provider Note  ____________________________________________  Time seen: Approximately 11:24 AM  I have reviewed the triage vital signs and the nursing notes.   HISTORY  Chief Complaint Allergic Reaction    HPI Tricia Miles is a 25 y.o. female today with complaint of left eyelid swollen and possible bug bites to her arms. Patient states that she has been itching great deal but is not taking any over-the-counter medication as she just woke up this morning like this. Patient denies any difficulty breathing or swallowing. Her only vision problems is that her left eyelid is swollen but otherwise her vision is within normal limits. Patient denies any previous allergies.She rates her pain is a 3 out of 10.   Past Medical History  Diagnosis Date  . No pertinent past medical history   . Medical history non-contributory   . Trichomonal vulvovaginitis     There are no active problems to display for this patient.   Past Surgical History  Procedure Laterality Date  . No past surgeries      Current Outpatient Rx  Name  Route  Sig  Dispense  Refill  . acetaminophen (TYLENOL) 325 MG tablet   Oral   Take 650 mg by mouth every 6 (six) hours as needed for mild pain.         Marland Kitchen. etonogestrel (IMPLANON) 68 MG IMPL implant   Subcutaneous   Inject 1 each into the skin once.         Marland Kitchen. ibuprofen (ADVIL,MOTRIN) 800 MG tablet   Oral   Take 1 tablet (800 mg total) by mouth 3 (three) times daily.   21 tablet   0   . oxyCODONE-acetaminophen (PERCOCET/ROXICET) 5-325 MG per tablet   Oral   Take 1-2 tablets by mouth every 6 (six) hours as needed for severe pain.   15 tablet   0   . predniSONE (DELTASONE) 10 MG tablet      Take 6 tablets  today, on day 2 take 5 tablets, day 3 take 4 tablets, day 4 take 3 tablets, day 5 take  2 tablets and 1 tablet the last day   21 tablet   0     Allergies Codeine; Tramadol; and  Vicodin  Family History  Problem Relation Age of Onset  . Anesthesia problems Neg Hx     Social History Social History  Substance Use Topics  . Smoking status: Current Every Day Smoker -- 0.30 packs/day    Types: Cigarettes  . Smokeless tobacco: Never Used  . Alcohol Use: No    Review of Systems Constitutional: No fever/chills Eyes: No visual changes. Positive left eye lid swollen. ENT: No sore throat. Cardiovascular: Denies chest pain. Respiratory: Denies shortness of breath. Gastrointestinal: No abdominal pain.  No nausea, no vomiting.   Musculoskeletal: Negative for back pain. Skin: Positive for skin rash Neurological: Negative for headaches, focal weakness or numbness.  10-point ROS otherwise negative.  ____________________________________________   PHYSICAL EXAM:  VITAL SIGNS: ED Triage Vitals  Enc Vitals Group     BP --      Pulse --      Resp --      Temp --      Temp src --      SpO2 --      Weight --      Height --      Head Cir --      Peak Flow --  Pain Score --      Pain Loc --      Pain Edu? --      Excl. in GC? --     Constitutional: Alert and oriented. Well appearing and in no acute distress, eating a chicken while sitting on stretcher.. Eyes: Conjunctivae are normal. PERRL. EOMI. Head: Atraumatic. Nose: No congestion/rhinnorhea. Mouth/Throat: Mucous membranes are moist.  Oropharynx non-erythematous. No edema present. Neck: No stridor.  Supple Hematological/Lymphatic/Immunilogical: No cervical lymphadenopathy. Cardiovascular: Normal rate, regular rhythm. Grossly normal heart sounds.  Good peripheral circulation. Respiratory: Normal respiratory effort.  No retractions. Lungs CTAB. Gastrointestinal: Soft and nontender. No distention.  Musculoskeletal: His upper and lower extremities without any difficulty. Neurologic:  Normal speech and language. No gross focal neurologic deficits are appreciated. No gait instability. Skin:  Skin is  warm, dry. Left upper eyelid is edematous, nontender. Upper and lower extremities with erythematous elongated macular lesions without vesicles. There is one area on the left hip there appears to be the same. Patient currently is scratching at most of these. Psychiatric: Mood and affect are normal. Speech and behavior are normal.  ____________________________________________   LABS (all labs ordered are listed, but only abnormal results are displayed)  Labs Reviewed - No data to display  PROCEDURES  Procedure(s) performed: None  Critical Care performed: No  ____________________________________________   INITIAL IMPRESSION / ASSESSMENT AND PLAN / ED COURSE  Pertinent labs & imaging results that were available during my care of the patient were reviewed by me and considered in my medical decision making (see chart for details).  Patient was given Benadryl) the emergency room and a injection of Decadron 10 mg IM. Patient was discharged on a prescription of prednisone and to continue taking Benadryl every 6 hours. She will follow-up with her primary care if any continued problems. ____________________________________________   FINAL CLINICAL IMPRESSION(S) / ED DIAGNOSES  Final diagnoses:  Allergic dermatitis eyelid, left  Rash and other nonspecific skin eruption  Itching      Tommi Rumps, PA-C 05/18/15 1535  Jene Every, MD 05/18/15 1547

## 2015-05-18 NOTE — ED Notes (Signed)
States she woke up with swelling to left eye and possible bug bites to arms

## 2015-05-18 NOTE — Discharge Instructions (Signed)
Allergies An allergy is when your body reacts to a substance in a way that is not normal. An allergic reaction can happen after you:  Eat something.  Breathe in something.  Touch something. WHAT KINDS OF ALLERGIES ARE THERE? You can be allergic to:  Things that are only around during certain seasons, like molds and pollens.  Foods.  Drugs.  Insects.  Animal dander. WHAT ARE SYMPTOMS OF ALLERGIES?  Puffiness (swelling). This may happen on the lips, face, tongue, mouth, or throat.  Sneezing.  Coughing.  Breathing loudly (wheezing).  Stuffy nose.  Tingling in the mouth.  A rash.  Itching.  Itchy, red, puffy areas of skin (hives).  Watery eyes.  Throwing up (vomiting).  Watery poop (diarrhea).  Dizziness.  Feeling faint or fainting.  Trouble breathing or swallowing.  A tight feeling in the chest.  A fast heartbeat. HOW ARE ALLERGIES DIAGNOSED? Allergies can be diagnosed with:  A medical and family history.  Skin tests.  Blood tests.  A food diary. A food diary is a record of all the foods, drinks, and symptoms you have each day.  The results of an elimination diet. This diet involves making sure not to eat certain foods and then seeing what happens when you start eating them again. HOW ARE ALLERGIES TREATED? There is no cure for allergies, but allergic reactions can be treated with medicine. Severe reactions usually need to be treated at a hospital.  HOW CAN REACTIONS BE PREVENTED? The best way to prevent an allergic reaction is to avoid the thing you are allergic to. Allergy shots and medicines can also help prevent reactions in some cases.   This information is not intended to replace advice given to you by your health care provider. Make sure you discuss any questions you have with your health care provider.   Document Released: 08/27/2012 Document Revised: 05/23/2014 Document Reviewed: 02/11/2014 Elsevier Interactive Patient Education [id number]  [employer]    Follow-up with your family doctor if any continued problems. Continue taking Benadryl one or 2 every 6 hours as needed for itching. Begin taking prednisone today as directed.

## 2015-08-29 ENCOUNTER — Encounter (HOSPITAL_COMMUNITY): Payer: Self-pay | Admitting: Emergency Medicine

## 2015-08-29 ENCOUNTER — Emergency Department (HOSPITAL_COMMUNITY)
Admission: EM | Admit: 2015-08-29 | Discharge: 2015-08-29 | Disposition: A | Discharge status: Home / Self Care | Payer: Medicaid Other | Attending: Emergency Medicine | Admitting: Emergency Medicine

## 2015-08-29 DIAGNOSIS — Z791 Long term (current) use of non-steroidal anti-inflammatories (NSAID): Secondary | ICD-10-CM | POA: Diagnosis not present

## 2015-08-29 DIAGNOSIS — F17210 Nicotine dependence, cigarettes, uncomplicated: Secondary | ICD-10-CM | POA: Insufficient documentation

## 2015-08-29 DIAGNOSIS — K029 Dental caries, unspecified: Secondary | ICD-10-CM | POA: Insufficient documentation

## 2015-08-29 DIAGNOSIS — K0889 Other specified disorders of teeth and supporting structures: Secondary | ICD-10-CM | POA: Diagnosis present

## 2015-08-29 MED ORDER — NAPROXEN 250 MG PO TABS
500.0000 mg | ORAL_TABLET | Freq: Once | ORAL | Status: AC
  Administered 2015-08-29: 500 mg via ORAL
  Filled 2015-08-29: qty 2

## 2015-08-29 MED ORDER — AMOXICILLIN-POT CLAVULANATE 875-125 MG PO TABS: 1.0000 | ORAL_TABLET | Freq: Two times a day (BID) | ORAL | Status: DC

## 2015-08-29 MED ORDER — NAPROXEN 500 MG PO TABS: 500.0000 mg | ORAL_TABLET | Freq: Two times a day (BID) | ORAL | Status: DC

## 2015-08-29 NOTE — ED Notes (Signed)
Declined W/C at D/C and was escorted to lobby by RN. 

## 2015-08-29 NOTE — Discharge Instructions (Signed)
Community Resource Guide Dental °The United Way’s “211” is a great source of information about community services available.  Access by dialing 2-1-1 from anywhere in [place], or by website -  www.nc211.org.  ° °Other Local Resources (Updated 05/2015) ° °Dental  Care °  °Services ° °  °Phone Number and Address  °Cost  °[facility] For children 0 - 25 years of age:  °• Cleaning °• Tooth brushing/flossing instruction °• Sealants, fillings, crowns °• Extractions °• Emergency treatment  [phone] °[address] °[place] , [place] [address] Charges based on family income.  Medicaid and some insurance plans accepted.   °  °[facility] [place] • Cleaning °• Sealants, fillings, crowns °• Extractions °• Emergency treatment [phone] °[address] °[place], [place] ° Pregnant women 18 years of age or older with a Medicaid card  °[facility] [place] • Cleaning °• Sealants, fillings, crowns °• Extractions °• Emergency treatment [phone] °[address] °[place], [place] Pregnant women 18 years of age or older with a Medicaid card  °[facility] For children 0 - 25 years of age:  °• Cleaning °• Tooth brushing/flossing instruction °• Sealants, fillings, crowns °• Extractions °• Emergency treatment °Limited orthodontic services for patients with Medicaid [phone] °[address] °[place], [place] [address] Medicaid and [place] Health Choice cover for children up to age 25 and pregnant women.  Parents of children up to age 25 without Medicaid pay a reduced fee at time of service.  °[facility] [place] For children 0 - 25 years of age:  °• Cleaning °• Tooth brushing/flossing instruction °• Sealants, fillings, crowns °• Extractions °• Emergency treatment °Limited orthodontic services for patients with Medicaid  [phone] °[address] °[place], [place].  Medicaid and [place] Health Choice cover for children up to age 25 and pregnant women.  Parents of children up to age 25 without Medicaid pay a reduced fee.  °[facility] • Cleaning °• Sealants, fillings, crowns °• Extractions ° °Hours: Tuesdays and Thursdays, 4:15 - 8 pm [phone] °[address] °[place], [place] [address] Services free of charge to  County residents ages 18-64 who do not have health insurance, Medicare, Medicaid, or VA benefits and fall within federal poverty guidelines  °[facility] ° ° ° Provides dental care in addition to primary medical care, nutritional counseling, and pharmacy: °• Cleaning °• Sealants, fillings, crowns °• Extractions ° ° ° ° ° ° ° ° ° ° ° ° ° ° ° ° ° [phone] °[facility], [address] °[place], [place] ° °[phone] °[facility], [address] [place], [place] ° °[phone] °[facility] °[place], [place] ° °[phone] °[facility], [address] °[place], [place] ° °[phone] °[facility] °[address] °[place], [place] Accepts Medicaid, Medicare, most insurance.  Also provides services available to all with fees adjusted based on ability to pay.    °[facility] • Cleaning °• Tooth brushing/flossing instruction °• Sealants, fillings, crowns °• Extractions °• Emergency treatment °Hours: Tuesdays, Thursdays, and Fridays from 8 am to 5 pm by appointment only. [phone] °[address] °[place], [place] [address] [place] residents with Medicaid (depending on eligibility) and children with  Health Choice - call for more information.  °  [facility] • Extractions only ° °Hours: 2nd and 4th Thursday of each month from 6:30 am - 9 am.   [phone]. [phone] °[address] °[place], [place] [address] Ages 18 and older only.  Patients are seen on a first come, first served basis.  °[facility] • Cleanings °• Fillings °• Extractions °• Orthodontics °• Endodontics °• Implants/Crowns/Bridges °• Complete and partial dentures [phone] °[place], [place] Patients must complete an application for services.  There is often a waiting list.   ° °

## 2015-08-29 NOTE — ED Provider Notes (Signed)
CSN: [id number]     Arrival date & time 08/29/15  1028 History  By signing my name below, I, Octavia Heirrianna Nassar, attest that this documentation has been prepared under the direction and in the presence of Gryffin Altice Y Kanesha Cadle, [place]-C. Electronically Signed: Octavia HeirArianna Nassar, ED Scribe. 08/29/2015. 11:31 AM.     Chief Complaint  Patient presents with  . Dental Pain  . Facial Swelling      The history is provided by the patient. No language interpreter was used.   HPI Comments: Tricia Miles is a 25 y.o. female who presents to the Emergency Department complaining of sudden onset, gradual worsening, "uncomfortable" left upper facial swelling onset this morning upon awakening. Pt says she has a hx of dental abscesses and reports she has a cavity in one of her left upper teeth. She says she currently has cheek pain where there is swelling but there is no dental pain, though she has had dental pain in her upper left quadrant in the past. She has not seen a dentist. She has not taken any medication to alleviate her pain or swelling. Denies fevers, chills, or difficulty swallowing.   Past Medical History  Diagnosis Date  . No pertinent past medical history   . Medical history non-contributory   . Trichomonal vulvovaginitis    Past Surgical History  Procedure Laterality Date  . No past surgeries     Family History  Problem Relation Age of Onset  . Anesthesia problems Neg Hx    Social History  Substance Use Topics  . Smoking status: Current Every Day Smoker -- 0.30 packs/day    Types: Cigarettes  . Smokeless tobacco: Never Used  . Alcohol Use: No   OB History    Gravida Para Term Preterm AB TAB SAB Ectopic Multiple Living   2 2 2  0 0 0 0 0 0 2     Review of Systems  Constitutional: Negative for fever and chills.  HENT: Positive for dental problem and facial swelling. Negative for trouble swallowing.   All other systems reviewed and are negative.     Allergies  Codeine; Tramadol; and  Vicodin  Home Medications   Prior to Admission medications   Medication Sig Start Date End Date Taking? Authorizing Provider  acetaminophen (TYLENOL) 325 MG tablet Take 650 mg by mouth every 6 (six) hours as needed for mild pain.    Historical Provider, MD  etonogestrel (IMPLANON) 68 MG IMPL implant Inject 1 each into the skin once.    Historical Provider, MD  ibuprofen (ADVIL,MOTRIN) 800 MG tablet Take 1 tablet (800 mg total) by mouth 3 (three) times daily. 04/01/15   Barrett HenleNicole Elizabeth Nadeau, PA-C  oxyCODONE-acetaminophen (PERCOCET/ROXICET) 5-325 MG per tablet Take 1-2 tablets by mouth every 6 (six) hours as needed for severe pain. 04/16/13   Heather Laisure, PA-C  predniSONE (DELTASONE) 10 MG tablet Take 6 tablets  today, on day 2 take 5 tablets, day 3 take 4 tablets, day 4 take 3 tablets, day 5 take  2 tablets and 1 tablet the last day 05/18/15   Tommi Rumpshonda L Summers, PA-C   Triage vitals: BP 111/69 mmHg  Pulse 85  Temp(Src) 98.4 F (36.9 C) (Oral)  Resp 20  SpO2 100%  LMP 07/29/2015 Physical Exam  Constitutional: She is oriented to person, place, and time. She appears well-developed and well-nourished.  HENT:  Head: Normocephalic.  Multiple dental carries, poor dental hygiene LUQ gingiva diffusely erythematous and edematous  Uvula midline, left maxillary cheek swollen  no erythema, tender, no submandicular or cervical lymphadenopathy   Eyes: EOM are normal.  Neck: Normal range of motion.  Pulmonary/Chest: Effort normal.  Abdominal: She exhibits no distension.  Musculoskeletal: Normal range of motion.  Neurological: She is alert and oriented to person, place, and time.  Psychiatric: She has a normal mood and affect.  Nursing note and vitals reviewed.   ED Course  Procedures  DIAGNOSTIC STUDIES: Oxygen Saturation is 100% on RA, normal by my interpretation.  COORDINATION OF CARE: 11:31 AM Discussed treatment plan which includes naproxen with pt at bedside and pt agreed to plan. Pt  will be given dental referral sheet and was advised to follow up with a dentist.  Labs Review Labs Reviewed - No data to display  Imaging Review No results found. I have personally reviewed and evaluated these images and lab results as part of my medical decision-making.   EKG Interpretation None      MDM   Final diagnoses:  Pain due to dental caries    Suspect facial swelling from underlying dental infection and cavities. No intraoral abscess visualized to drain in the ED. No posterior oropharyngeal swelling. Mild left maxillary swelling but no overlying erythema. No trismus. Discussed with pt can cover with abx and NSAIDs at this time but ultimately she will need to see a dentist/OMFS for definitive treatment. She verbalized her understanding. Rx given for augmentin and naproxen. Dental resource guide given. ER return precautions given.  I personally performed the services described in this documentation, which was scribed in my presence. The recorded information has been reviewed and is accurate.   Carlene Coria, PA-C 08/29/15 1332  Margarita Grizzle, MD 08/30/15 [phone]

## 2015-08-29 NOTE — ED Notes (Signed)
Pt states woke up this morning with left facial swelling-- poor dental hygeine-- multiple dental caries on left side upper and lower.

## 2015-09-12 ENCOUNTER — Encounter (HOSPITAL_COMMUNITY): Payer: Self-pay | Admitting: *Deleted

## 2015-09-12 ENCOUNTER — Emergency Department (HOSPITAL_COMMUNITY)
Admission: EM | Admit: 2015-09-12 | Discharge: 2015-09-12 | Disposition: A | Discharge status: Home / Self Care | Payer: Medicaid Other | Attending: Emergency Medicine | Admitting: Emergency Medicine

## 2015-09-12 DIAGNOSIS — Z791 Long term (current) use of non-steroidal anti-inflammatories (NSAID): Secondary | ICD-10-CM | POA: Insufficient documentation

## 2015-09-12 DIAGNOSIS — R300 Dysuria: Secondary | ICD-10-CM | POA: Diagnosis present

## 2015-09-12 DIAGNOSIS — Z3202 Encounter for pregnancy test, result negative: Secondary | ICD-10-CM | POA: Insufficient documentation

## 2015-09-12 DIAGNOSIS — Z8619 Personal history of other infectious and parasitic diseases: Secondary | ICD-10-CM | POA: Insufficient documentation

## 2015-09-12 DIAGNOSIS — M545 Low back pain: Secondary | ICD-10-CM | POA: Diagnosis not present

## 2015-09-12 DIAGNOSIS — F17210 Nicotine dependence, cigarettes, uncomplicated: Secondary | ICD-10-CM | POA: Insufficient documentation

## 2015-09-12 LAB — URINALYSIS, ROUTINE W REFLEX MICROSCOPIC
Bilirubin Urine: NEGATIVE
Glucose, UA: NEGATIVE mg/dL
HGB URINE DIPSTICK: NEGATIVE
Ketones, ur: NEGATIVE mg/dL
LEUKOCYTES UA: NEGATIVE
NITRITE: NEGATIVE
Protein, ur: NEGATIVE mg/dL
SPECIFIC GRAVITY, URINE: 1.011 (ref 1.005–1.030)
pH: 6 (ref 5.0–8.0)

## 2015-09-12 LAB — POC URINE PREG, ED: PREG TEST UR: NEGATIVE

## 2015-09-12 MED ORDER — CEPHALEXIN 500 MG PO CAPS: 500.0000 mg | ORAL_CAPSULE | Freq: Four times a day (QID) | ORAL | Status: AC

## 2015-09-12 NOTE — ED Provider Notes (Signed)
CSN: [id number]     Arrival date & time 09/12/15  1922 History  By signing my name below, I, Emmanuella Mensah, attest that this documentation has been prepared under the direction and in the presence of Roxy Horsemanobert Alp Goldwater, PA-C. Electronically Signed: Angelene GiovanniEmmanuella Mensah, ED Scribe. 09/12/2015. 8:20 PM.    Chief Complaint  Patient presents with  . poss uti    The history is provided by the patient. No language interpreter was used.   HPI Comments: Tricia Miles is a 25 y.o. female who presents to the Emergency Department complaining of ongoing burning with urination onset today. Pt explains her symptoms as  Urinating "needles". She reports associated lower back pain 2 days ago. She states that she has had a UTI in the past and these symptoms are similar. No alleviating factors noted. Pt has not tried any medication PTA. She states that her last sexual activity was approx. 8 months ago. Pt denies any fever or n/v.   Past Medical History  Diagnosis Date  . No pertinent past medical history   . Medical history non-contributory   . Trichomonal vulvovaginitis    Past Surgical History  Procedure Laterality Date  . No past surgeries     Family History  Problem Relation Age of Onset  . Anesthesia problems Neg Hx    Social History  Substance Use Topics  . Smoking status: Current Every Day Smoker -- 0.30 packs/day    Types: Cigarettes  . Smokeless tobacco: Never Used  . Alcohol Use: No   OB History    Gravida Para Term Preterm AB TAB SAB Ectopic Multiple Living   2 2 2  0 0 0 0 0 0 2     Review of Systems  Constitutional: Negative for fever and chills.  Gastrointestinal: Negative for nausea and vomiting.  Genitourinary: Positive for dysuria.      Allergies  Codeine; Tramadol; and Vicodin  Home Medications   Prior to Admission medications   Medication Sig Start Date End Date Taking? Authorizing Provider  acetaminophen (TYLENOL) 325 MG tablet Take 650 mg by mouth every 6  (six) hours as needed for mild pain.    Historical Provider, MD  amoxicillin-clavulanate (AUGMENTIN) 875-125 MG tablet Take 1 tablet by mouth every 12 (twelve) hours. 08/29/15   Carlene CoriaSerena Y Sam, PA-C  etonogestrel (IMPLANON) 68 MG IMPL implant Inject 1 each into the skin once.    Historical Provider, MD  ibuprofen (ADVIL,MOTRIN) 800 MG tablet Take 1 tablet (800 mg total) by mouth 3 (three) times daily. 04/01/15   Barrett HenleNicole Elizabeth Nadeau, PA-C  naproxen (NAPROSYN) 500 MG tablet Take 1 tablet (500 mg total) by mouth 2 (two) times daily. 08/29/15   Carlene CoriaSerena Y Sam, PA-C  oxyCODONE-acetaminophen (PERCOCET/ROXICET) 5-325 MG per tablet Take 1-2 tablets by mouth every 6 (six) hours as needed for severe pain. 04/16/13   Heather Laisure, PA-C  predniSONE (DELTASONE) 10 MG tablet Take 6 tablets  today, on day 2 take 5 tablets, day 3 take 4 tablets, day 4 take 3 tablets, day 5 take  2 tablets and 1 tablet the last day 05/18/15   Tommi Rumpshonda L Summers, PA-C   BP 114/67 mmHg  Pulse 103  Temp(Src) 98.2 F (36.8 C) (Oral)  Resp 18  Ht 5\' 7"  (1.702 m)  Wt 128 lb (58.06 kg)  BMI 20.04 kg/m2  LMP 07/15/2015 Physical Exam Physical Exam  Constitutional: Pt appears well-developed and well-nourished. No distress.  Awake, alert, nontoxic appearance  HENT:  Head: Normocephalic and  atraumatic.  Mouth/Throat: Oropharynx is clear and moist. No oropharyngeal exudate.  Eyes: Conjunctivae are normal. No scleral icterus.  Neck: Normal range of motion. Neck supple.  Cardiovascular: Normal rate, regular rhythm and intact distal pulses.   Pulmonary/Chest: Effort normal and breath sounds normal. No respiratory distress. Pt has no wheezes.  Equal chest expansion  Abdominal: Soft. Bowel sounds are normal. Pt exhibits no mass. There is no tenderness. There is no rebound and no guarding.  Musculoskeletal: Normal range of motion. Pt exhibits no edema.  Neurological: Pt is alert.  Speech is clear and goal oriented Moves extremities without  ataxia  Skin: Skin is warm and dry. Pt is not diaphoretic.  Psychiatric: Pt has a normal mood and affect.  Nursing note and vitals reviewed.  ED Course  Procedures (including critical care time) DIAGNOSTIC STUDIES: Oxygen Saturation is 100% on RA, normal by my interpretation.    COORDINATION OF CARE: 8:17 PM- Pt advised of plan for treatment and pt agrees. Pt will be treated with antibiotics. She will also receive UA for further evaluation.   Labs Review Labs Reviewed  URINALYSIS, ROUTINE W REFLEX MICROSCOPIC (NOT AT [facility]) - Abnormal; Notable for the following:    APPearance CLOUDY (*)    All other components within normal limits  URINE CULTURE  POC URINE PREG, ED    Roxy Horseman, PA-C has personally reviewed and evaluated these lab results as part of his medical decision-making.  MDM   Final diagnoses:  Dysuria    Patient with dysuria. Urinalysis is mostly unremarkable, however the patient's symptoms are consistent with UTI. Will treat with Keflex, and will get urine culture. Urine pregnancy test negative. No abdominal pain on exam. Vital signs are stable. Discharge to home.  No vaginal discharge.  I personally performed the services described in this documentation, which was scribed in my presence. The recorded information has been reviewed and is accurate.     Roxy Horseman, PA-C 09/12/15 1610  Pricilla Loveless, MD 09/17/15 [phone]

## 2015-09-12 NOTE — ED Notes (Signed)
The pt reports that she has a history of utis.  She has had abd and back pain when urinating for 2 days.  She has also seen blood in her urine.  No nv  lmp 2 weeks ago

## 2015-09-12 NOTE — ED Notes (Signed)
Pt verbalized understanding of d/c instructions and has no further questions. Pt stable and NAD.  

## 2015-09-12 NOTE — Discharge Instructions (Signed)

## 2015-09-14 LAB — URINE CULTURE: Culture: NO GROWTH

## 2015-10-04 ENCOUNTER — Encounter (HOSPITAL_COMMUNITY): Payer: Self-pay | Admitting: Adult Health

## 2015-10-04 ENCOUNTER — Emergency Department (HOSPITAL_COMMUNITY)
Admission: EM | Admit: 2015-10-04 | Discharge: 2015-10-04 | Disposition: A | Discharge status: Home / Self Care | Payer: Medicaid Other | Attending: Emergency Medicine | Admitting: Emergency Medicine

## 2015-10-04 DIAGNOSIS — K047 Periapical abscess without sinus: Secondary | ICD-10-CM | POA: Insufficient documentation

## 2015-10-04 DIAGNOSIS — F17210 Nicotine dependence, cigarettes, uncomplicated: Secondary | ICD-10-CM | POA: Insufficient documentation

## 2015-10-04 DIAGNOSIS — Z792 Long term (current) use of antibiotics: Secondary | ICD-10-CM | POA: Diagnosis not present

## 2015-10-04 DIAGNOSIS — R220 Localized swelling, mass and lump, head: Secondary | ICD-10-CM | POA: Diagnosis present

## 2015-10-04 DIAGNOSIS — Z79891 Long term (current) use of opiate analgesic: Secondary | ICD-10-CM | POA: Diagnosis not present

## 2015-10-04 DIAGNOSIS — Z791 Long term (current) use of non-steroidal anti-inflammatories (NSAID): Secondary | ICD-10-CM | POA: Insufficient documentation

## 2015-10-04 MED ORDER — PENICILLIN V POTASSIUM 500 MG PO TABS: 500.0000 mg | ORAL_TABLET | Freq: Four times a day (QID) | ORAL | Status: AC

## 2015-10-04 MED ORDER — NAPROXEN 500 MG PO TABS: 500.0000 mg | ORAL_TABLET | Freq: Two times a day (BID) | ORAL | Status: AC

## 2015-10-04 NOTE — Discharge Instructions (Signed)
Take the prescribed medication as directed. Follow-up with dentist-- may use dentist on call or see resource guide below for other options. Return to the ED for new or worsening symptoms.  [employer] Guide Dental The United Ways 211 is a great source of information about community services available.  Access by dialing 2-1-1 from anywhere in [place], or by website -  PooledIncome.pl.   Other Local Resources (Updated 05/2015)  Dental  Care   Services    Phone Number and Address  Cost  [facility] For children 27 - 74 years of age:   Cleaning  Tooth brushing/flossing instruction  Sealants, fillings, crowns  Extractions  Emergency treatment  [phone] [address] [place], [place] [address] Charges based on family income.  Medicaid and some insurance plans accepted.     [facility], fillings, crowns  Extractions  Emergency treatment [phone] [place], [place]  Pregnant women 42 years of age or older with a Medicaid card  [facility] [place]  Cleaning  Sealants, fillings, crowns  Extractions  Emergency treatment [phone] [address] [place], [place] Pregnant women 69 years of age or older with a Medicaid card  [facility] For children 36 - 57 years of age:   Cleaning  Tooth brushing/flossing instruction  Sealants, fillings, crowns  Extractions  Emergency treatment Limited orthodontic services for patients with Medicaid [phone] [address] [place], [place] [address] Medicaid and [facility] Health Choice cover for children up to age 18 and pregnant women.  Parents of children up to age 11 without Medicaid pay a reduced fee at time of service.  [facility] [employer] For children 0 - 21 years  of age:   Cleaning  Tooth brushing/flossing instruction  Sealants, fillings, crowns  Extractions  Emergency treatment Limited orthodontic services for patients with Medicaid [phone] [address] [place], [place].  Medicaid and [place] Health Choice cover for children up to age 47 and pregnant women.  Parents of children up to age 60 without Medicaid pay a reduced fee.  [facility]  Sealants, fillings, crowns  Extractions  Hours: Tuesdays and Thursdays, 4:15 - 8 pm [phone] [address] [place], [place] [address] Services free of charge to [facility] residents ages 18-64 who do not have health insurance, Medicare, [place], or [place] benefits and fall within federal poverty guidelines  [employer]    Provides dental care in addition to primary medical care, nutritional counseling, and pharmacy:  Nurse, mental health, fillings, crowns  Extractions                  [phone] [facility], [address] [place], [place]  [phone] [facility], [address] [place] [place], [place]  [phone] [facility] [place], [place]  [phone] [facility], [address] [place], [place]  [phone] [facility] [address] [place], [place] Accepts [place], [place], most insurance.  Also provides services available to all with fees adjusted based on ability to pay.    [facility]  Cleaning  Tooth brushing/flossing instruction  Sealants, fillings, crowns  Extractions  Emergency treatment Hours: Tuesdays, Thursdays, and Fridays from 8 am  to 5 pm by appointment only. [phone] [address] [place], [place] [phone] [facility] residents with Medicaid (depending on eligibility) and children with [facility] Health Choice - call  for more information.  [facility]  Extractions only  Hours: 2nd and 4th Thursday of each month from 6:30 am - 9 am.   [phone] [address] [place], [place] [phone] Ages 4118 and older only.  Patients are seen on a first come, first served basis.  [employer]  [employer]  Fillings  Extractions  Orthodontics  Endodontics  Implants/Crowns/Bridges  Complete and partial dentures [phone] [place], [place] Patients must complete an application for services.  There is often a waiting list.

## 2015-10-04 NOTE — ED Notes (Signed)
Presents with left sided facial swelling began yesterday and has worsened. Pt has poor dental hygiene, multiple caries-and hx of same.

## 2015-10-04 NOTE — ED Provider Notes (Signed)
CSN: [id number]     Arrival date & time 10/04/15  6440 History   First MD Initiated Contact with Patient 10/04/15 [phone]     Chief Complaint  Patient presents with  . Facial Swelling     (Consider location/radiation/quality/duration/timing/severity/associated sxs/prior Treatment) The history is provided by the patient and medical records.   25 y.o. F here with dental pain and left sided facial swelling.  Patient states this began yesterday, has been worsening since then.  Denies fever, chills, sweats.  Pain worse with opening her mouth wide or eating/drinking.  No difficulty swallowing.  States similar abscess occurred 2 weeks ago, responded well to abx.  She has not followed up with dentist yet-- states her insurance does not cover dental and she cannot afford for pay out of pocket.  Past Medical History  Diagnosis Date  . No pertinent past medical history   . Medical history non-contributory   . Trichomonal vulvovaginitis    Past Surgical History  Procedure Laterality Date  . No past surgeries     Family History  Problem Relation Age of Onset  . Anesthesia problems Neg Hx    Social History  Substance Use Topics  . Smoking status: Current Every Day Smoker -- 0.30 packs/day    Types: Cigarettes  . Smokeless tobacco: Never Used  . Alcohol Use: No   OB History    Gravida Para Term Preterm AB TAB SAB Ectopic Multiple Living   0 0 0 0 0 0 2     Review of Systems  HENT: Positive for dental problem.   All other systems reviewed and are negative.     Allergies  Codeine; Tramadol; and Vicodin  Home Medications   Prior to Admission medications   Medication Sig Start Date End Date Taking? Authorizing Provider  acetaminophen (TYLENOL) 325 MG tablet Take 650 mg by mouth every 6 (six) hours as needed for mild pain.    Historical Provider, MD  amoxicillin-clavulanate (AUGMENTIN) 875-125 MG tablet Take 1 tablet by mouth every 12 (twelve) hours. 08/29/15   Ace Gins Sam, PA-C   cephALEXin (KEFLEX) 500 MG capsule Take 1 capsule (500 mg total) by mouth 4 (four) times daily. 09/12/15   Roxy Horseman, PA-C  etonogestrel (IMPLANON) 68 MG IMPL implant Inject 1 each into the skin once.    Historical Provider, MD  ibuprofen (ADVIL,MOTRIN) 800 MG tablet Take 1 tablet (800 mg total) by mouth 3 (three) times daily. 04/01/15   Barrett Henle, PA-C  naproxen (NAPROSYN) 500 MG tablet Take 1 tablet (500 mg total) by mouth 2 (two) times daily. 08/29/15   Carlene Coria, PA-C  oxyCODONE-acetaminophen (PERCOCET/ROXICET) 5-325 MG per tablet Take 1-2 tablets by mouth every 6 (six) hours as needed for severe pain. 04/16/13   Heather Laisure, PA-C  predniSONE (DELTASONE) 10 MG tablet Take 6 tablets  today, on day 2 take 5 tablets, day 3 take 4 tablets, day 4 take 3 tablets, day 5 take  2 tablets and 1 tablet the last day 05/18/15   Tommi Rumps, PA-C   BP 111/88 mmHg  Pulse 115  Temp(Src) 98.1 F (36.7 C) (Oral)  Resp 16  SpO2 100%  LMP 09/14/2015 (Exact Date)   Physical Exam  Constitutional: She is oriented to person, place, and time. She appears well-developed and well-nourished. No distress.  HENT:  Head: Normocephalic and atraumatic.  Mouth/Throat: Uvula is midline, oropharynx is clear and moist and mucous membranes are normal. Abnormal dentition. Dental  abscesses and dental caries present. No oropharyngeal exudate, posterior oropharyngeal edema, posterior oropharyngeal erythema or tonsillar abscesses.  Teeth largely in very poor dentition, left lower molars are broken with large cavities present, surrounding gingiva swollen and erythematous without fluctuance, handling secretions appropriately, no trismus, mild left lower cheek swelling without extension into neck, normal phonation without stridor  Eyes: Conjunctivae and EOM are normal. Pupils are equal, round, and reactive to light.  Neck: Normal range of motion. Neck supple.  Cardiovascular: Normal rate, regular rhythm  and normal heart sounds.   Pulmonary/Chest: Effort normal and breath sounds normal. No respiratory distress. She has no wheezes.  Musculoskeletal: Normal range of motion.  Neurological: She is alert and oriented to person, place, and time.  Skin: Skin is warm and dry. She is not diaphoretic.  Psychiatric: She has a normal mood and affect.  Nursing note and vitals reviewed.   ED Course  Procedures (including critical care time) Labs Review Labs Reviewed - No data to display  Imaging Review No results found. I have personally reviewed and evaluated these images and lab results as part of my medical decision-making.   EKG Interpretation None      MDM   Final diagnoses:  Dental abscess   25 y.o. F here with left lower dental abscess.  Does have mild swelling of left lower cheek without extension into neck.  Continues handling secretions well, normal phonation without stridor.  Not clinically consistent with ludwig's angina.  Will re-start abx, naprosyn for pain.  Strongly encouraged to follow-up with dentist-- given referrals and resource guide in which to do this.  Discussed plan with patient, he/she acknowledged understanding and agreed with plan of care.  Return precautions given for new or worsening symptoms.  Garlon HatchetLisa M Hendel Gatliff, PA-C 10/04/15 [id number]  Gerhard Munchobert Lockwood, MD 10/04/15 [phone]

## 2015-10-04 NOTE — ED Notes (Signed)
Declined W/C at D/C and was escorted to lobby by RN. 

## 2015-11-27 ENCOUNTER — Encounter (HOSPITAL_COMMUNITY): Payer: Self-pay | Admitting: *Deleted

## 2015-11-27 ENCOUNTER — Emergency Department (HOSPITAL_COMMUNITY): Payer: Medicaid Other

## 2015-11-27 ENCOUNTER — Emergency Department (HOSPITAL_COMMUNITY)
Admission: EM | Admit: 2015-11-27 | Discharge: 2015-11-27 | Disposition: A | Discharge status: Home / Self Care | Payer: Medicaid Other | Attending: Emergency Medicine | Admitting: Emergency Medicine

## 2015-11-27 DIAGNOSIS — F17210 Nicotine dependence, cigarettes, uncomplicated: Secondary | ICD-10-CM | POA: Insufficient documentation

## 2015-11-27 DIAGNOSIS — R51 Headache: Secondary | ICD-10-CM | POA: Diagnosis present

## 2015-11-27 DIAGNOSIS — R519 Headache, unspecified: Secondary | ICD-10-CM

## 2015-11-27 DIAGNOSIS — Z7982 Long term (current) use of aspirin: Secondary | ICD-10-CM | POA: Diagnosis not present

## 2015-11-27 MED ORDER — PROCHLORPERAZINE EDISYLATE 5 MG/ML IJ SOLN
10.0000 mg | Freq: Once | INTRAMUSCULAR | Status: AC
  Administered 2015-11-27: 10 mg via INTRAVENOUS
  Filled 2015-11-27: qty 2

## 2015-11-27 MED ORDER — DEXAMETHASONE SODIUM PHOSPHATE 10 MG/ML IJ SOLN
10.0000 mg | Freq: Once | INTRAMUSCULAR | Status: AC
  Administered 2015-11-27: 10 mg via INTRAVENOUS
  Filled 2015-11-27: qty 1

## 2015-11-27 MED ORDER — KETOROLAC TROMETHAMINE 30 MG/ML IJ SOLN
30.0000 mg | Freq: Once | INTRAMUSCULAR | Status: AC
  Administered 2015-11-27: 30 mg via INTRAVENOUS
  Filled 2015-11-27: qty 1

## 2015-11-27 NOTE — ED Notes (Signed)
Pt has been getting migraines for the last several months and now it is almost everyday. Sensitive to light and sound.  Pt reports some nausea, but no vomiting, Pt states the pain is always on the right side

## 2015-11-27 NOTE — ED Provider Notes (Signed)
CSN: [id number]     Arrival date & time 11/27/15  1755 History   First MD Initiated Contact with Patient 11/27/15 2222     Chief Complaint  Patient presents with  . Migraine     (Consider location/radiation/quality/duration/timing/severity/associated sxs/prior Treatment) HPI Tricia Miles is a 25 y.o. female with no medical problems, presents to ED with complaint of right sided headache. Pt states headaches initially started several months ago. Initially intermittent, right sided, occuring about once a week. States in the last few weeks, headaches increased in frequency and severity, and states now they are daily. She is taking excedrine migraine for them. She states she also went to see her doctor a few weeks ago and was started on sumatriptan. She states it is not helping. She reports associated photophobia. Reports nausea. Denies vomiting. Denies fever or chills. No neck pain or stiffness. He reports intermittent blurred vision in her right eye. No numbness or weakness in extremities. No difficulty with memory, speech or ambulation. No nasal congestion or sinus pressure. No imaging prior to today.  Past Medical History  Diagnosis Date  . No pertinent past medical history   . Medical history non-contributory   . Trichomonal vulvovaginitis    Past Surgical History  Procedure Laterality Date  . No past surgeries     Family History  Problem Relation Age of Onset  . Anesthesia problems Neg Hx    Social History  Substance Use Topics  . Smoking status: Current Every Day Smoker -- 0.30 packs/day    Types: Cigarettes  . Smokeless tobacco: Never Used  . Alcohol Use: No   OB History    Gravida Para Term Preterm AB TAB SAB Ectopic Multiple Living   2 2 2  0 0 0 0 0 0 2     Review of Systems  Constitutional: Negative for fever and chills.  HENT: Negative for congestion and facial swelling.   Eyes: Positive for photophobia and visual disturbance.  Respiratory: Negative for cough,  chest tightness and shortness of breath.   Cardiovascular: Negative for chest pain, palpitations and leg swelling.  Gastrointestinal: Negative for nausea, vomiting, abdominal pain and diarrhea.  Musculoskeletal: Negative for myalgias, arthralgias, neck pain and neck stiffness.  Skin: Negative for rash.  Neurological: Positive for headaches. Negative for dizziness and weakness.  All other systems reviewed and are negative.     Allergies  Codeine; Tramadol; and Vicodin  Home Medications   Prior to Admission medications   Medication Sig Start Date End Date Taking? Authorizing Provider  aspirin-acetaminophen-caffeine (EXCEDRIN MIGRAINE) [phone] MG tablet Take 1 tablet by mouth every 6 (six) hours as needed for headache.   Yes Historical Provider, MD  amoxicillin-clavulanate (AUGMENTIN) 875-125 MG tablet Take 1 tablet by mouth every 12 (twelve) hours. Patient not taking: Reported on 11/27/2015 08/29/15   Ace Gins Sam, PA-C  cephALEXin (KEFLEX) 500 MG capsule Take 1 capsule (500 mg total) by mouth 4 (four) times daily. Patient not taking: Reported on 11/27/2015 09/12/15   Roxy Horseman, PA-C  ibuprofen (ADVIL,MOTRIN) 800 MG tablet Take 1 tablet (800 mg total) by mouth 3 (three) times daily. Patient not taking: Reported on 11/27/2015 04/01/15   Barrett Henle, PA-C  naproxen (NAPROSYN) 500 MG tablet Take 1 tablet (500 mg total) by mouth 2 (two) times daily with a meal. Patient not taking: Reported on 11/27/2015 10/04/15   Garlon Hatchet, PA-C  oxyCODONE-acetaminophen (PERCOCET/ROXICET) 5-325 MG per tablet Take 1-2 tablets by mouth every 6 (six) hours as  needed for severe pain. Patient not taking: Reported on 11/27/2015 04/16/13   Santiago GladHeather Laisure, PA-C  penicillin v potassium (VEETID) 500 MG tablet Take 1 tablet (500 mg total) by mouth 4 (four) times daily. Patient not taking: Reported on 11/27/2015 10/04/15   Garlon HatchetLisa M Sanders, PA-C  predniSONE (DELTASONE) 10 MG tablet Take 6 tablets  today,  on day 2 take 5 tablets, day 3 take 4 tablets, day 4 take 3 tablets, day 5 take  2 tablets and 1 tablet the last day Patient not taking: Reported on 11/27/2015 05/18/15   Tommi Rumpshonda L Summers, PA-C   BP 121/79 mmHg  Pulse 79  Temp(Src) 99.2 F (37.3 C) (Oral)  Resp 13  SpO2 100% Physical Exam  Constitutional: She is oriented to person, place, and time. She appears well-developed and well-nourished. No distress.  HENT:  Head: Normocephalic.  Eyes: Conjunctivae and EOM are normal. Pupils are equal, round, and reactive to light.  Neck: Normal range of motion. Neck supple.  No meningismus  Cardiovascular: Normal rate, regular rhythm and normal heart sounds.   Pulmonary/Chest: Effort normal and breath sounds normal. No respiratory distress. She has no wheezes. She has no rales.  Musculoskeletal: She exhibits no edema.  Neurological: She is alert and oriented to person, place, and time. No cranial nerve deficit. Coordination normal.  5/5 and equal upper and lower extremity strength bilaterally. Equal grip strength bilaterally. Normal finger to nose and heel to shin. No pronator drift.   Skin: Skin is warm and dry.  Psychiatric: She has a normal mood and affect. Her behavior is normal.  Nursing note and vitals reviewed.   ED Course  Procedures (including critical care time) Labs Review Labs Reviewed - No data to display  Imaging Review Ct Head Wo Contrast  11/27/2015  CLINICAL DATA:  25 year old female with headache. EXAM: CT HEAD WITHOUT CONTRAST TECHNIQUE: Contiguous axial images were obtained from the base of the skull through the vertex without intravenous contrast. COMPARISON:  None. FINDINGS: The ventricles and the sulci are appropriate in size for the patient's age. There is no intracranial hemorrhage. No midline shift or mass effect identified. The gray-white matter differentiation is preserved. The visualized paranasal sinuses and mastoid air cells are well aerated. The calvarium is  intact. IMPRESSION: No acute intracranial pathology. Electronically Signed   By: Elgie CollardArash  Radparvar M.D.   On: 11/27/2015 23:06   I have personally reviewed and evaluated these images and lab results as part of my medical decision-making.   EKG Interpretation None      MDM   Final diagnoses:  Nonintractable headache, unspecified chronicity pattern, unspecified headache type   Patient in emergency department with right-sided headache, with associated photophobia and nausea. Normal neurological exam. Headaches have been there for several months but worsening in duration and frequency. No improvement with Excedrin or sumatriptan at home. Will get imaging, CT of the head, will try migraine cocktail.  Patient just received her medications and said that she is feeling better and she needed to be discharged because she needed to get her car to her mother. Her CT scan is negative. We'll discharge her home with close follow-up with primary care doctor. Return precautions discussed. At this time, I do not think patient has meningitis given no fever or meningismus on exam. Doubt bleed since no injury and intermittent pain for several moths.  Pt will follow up for further imaging if headache not improving.   Filed Vitals:   11/27/15 1817 11/27/15 2312  BP:  130/75 121/79  Pulse: 89 79  Temp: 99.2 F (37.3 C)   TempSrc: Oral   Resp: 18 13  SpO2: 100% 100%     Jaynie Crumble, PA-C 11/28/15 0010  Leta Baptist, MD 12/07/15 [phone]

## 2015-11-27 NOTE — Discharge Instructions (Signed)
Make sure to drink plenty of fluids. Get good night sleep. Follow up with family doctor or return here if worsening symptoms.    General Headache Without Cause A headache is pain or discomfort felt around the head or neck area. There are many causes and types of headaches. In some cases, the cause may not be found.  HOME CARE  Managing Pain  Take over-the-counter and prescription medicines only as told by your doctor.  Lie down in a dark, quiet room when you have a headache.  If directed, apply ice to the head and neck area:  Put ice in a plastic bag.  Place a towel between your skin and the bag.  Leave the ice on for 20 minutes, 2-3 times per day.  Use a heating pad or hot shower to apply heat to the head and neck area as told by your doctor.  Keep lights dim if bright lights bother you or make your headaches worse. Eating and Drinking  Eat meals on a regular schedule.  Lessen how much alcohol you drink.  Lessen how much caffeine you drink, or stop drinking caffeine. General Instructions  Keep all follow-up visits as told by your doctor. This is important.  Keep a journal to find out if certain things bring on headaches. For example, write down:  What you eat and drink.  How much sleep you get.  Any change to your diet or medicines.  Relax by getting a massage or doing other relaxing activities.  Lessen stress.  Sit up straight. Do not tighten (tense) your muscles.  Do not use tobacco products. This includes cigarettes, chewing tobacco, or e-cigarettes. If you need help quitting, ask your doctor.  Exercise regularly as told by your doctor.  Get enough sleep. This often means 7-9 hours of sleep. GET HELP IF:  Your symptoms are not helped by medicine.  You have a headache that feels different than the other headaches.  You feel sick to your stomach (nauseous) or you throw up (vomit).  You have a fever. GET HELP RIGHT AWAY IF:   Your headache becomes  really bad.  You keep throwing up.  You have a stiff neck.  You have trouble seeing.  You have trouble speaking.  You have pain in the eye or ear.  Your muscles are weak or you lose muscle control.  You lose your balance or have trouble walking.  You feel like you will pass out (faint) or you pass out.  You have confusion.   This information is not intended to replace advice given to you by your health care provider. Make sure you discuss any questions you have with your health care provider.   Document Released: 02/09/2008 Document Revised: 01/21/2015 Document Reviewed: 08/25/2014 Elsevier Interactive Patient Education [employer]2016 [employer] Inc[employer]

## 2015-11-27 NOTE — ED Notes (Signed)
Pt called out stating she needed to go home b/c her mom needed the car. PA at bedside, pt informed us both that pt friend who is in waiting room will be driving the car home.

## 2016-01-12 ENCOUNTER — Emergency Department (HOSPITAL_COMMUNITY)
Admission: EM | Admit: 2016-01-12 | Discharge: 2016-01-12 | Disposition: A | Discharge status: Home / Self Care | Payer: Medicaid Other | Attending: Emergency Medicine | Admitting: Emergency Medicine

## 2016-01-12 ENCOUNTER — Encounter (HOSPITAL_COMMUNITY): Payer: Self-pay

## 2016-01-12 DIAGNOSIS — F17210 Nicotine dependence, cigarettes, uncomplicated: Secondary | ICD-10-CM | POA: Diagnosis not present

## 2016-01-12 DIAGNOSIS — K0889 Other specified disorders of teeth and supporting structures: Secondary | ICD-10-CM | POA: Diagnosis present

## 2016-01-12 DIAGNOSIS — K068 Other specified disorders of gingiva and edentulous alveolar ridge: Secondary | ICD-10-CM

## 2016-01-12 MED ORDER — IBUPROFEN 600 MG PO TABS: 600.0000 mg | ORAL_TABLET | Freq: Four times a day (QID) | ORAL | 0 refills | Status: DC | PRN

## 2016-01-12 MED ORDER — AMOXICILLIN 500 MG PO CAPS: 500.0000 mg | ORAL_CAPSULE | Freq: Two times a day (BID) | ORAL | 0 refills | Status: AC

## 2016-01-12 MED ORDER — OXYCODONE-ACETAMINOPHEN 5-325 MG PO TABS
1.0000 | ORAL_TABLET | Freq: Once | ORAL | Status: AC
  Administered 2016-01-12: 1 via ORAL
  Filled 2016-01-12: qty 1

## 2016-01-12 MED ORDER — AMOXICILLIN 500 MG PO CAPS
500.0000 mg | ORAL_CAPSULE | Freq: Once | ORAL | Status: AC
  Administered 2016-01-12: 500 mg via ORAL
  Filled 2016-01-12: qty 1

## 2016-01-12 NOTE — Discharge Instructions (Signed)
Your gums appear to be healing. There is an area on the upper left that has a small amount of drainage. Take the medications and follow up with the doctor who extracted the teeth to discuss further pain management.

## 2016-01-12 NOTE — ED Provider Notes (Signed)
[facility]-EMERGENCY DEPT Provider Note   CSN: [id number] Arrival date & time: 01/12/16  1929   By signing my name below, I, Christel Mormon, attest that this documentation has been prepared under the direction and in the presence of Kerrie Buffalo, NP Electronically Signed: Christel Mormon, Scribe. 01/12/2016. 8:26 PM.   History   Chief Complaint Chief Complaint  Patient presents with  . Dental Pain    The history is provided by the patient. No language interpreter was used.  Dental Pain   This is a new problem. The current episode started more than 2 days ago (1 week). The problem occurs constantly. The problem has not changed since onset.  HPI Comments:  Tricia Miles is a 25 y.o. female who presents to the Emergency Department complaining of constant dental pain x1 week. Pt had all of her teeth extracted x1 week in [place] and was given ibuprofen and percocet. Pt was also given antibiotics but has run out. Pt notes a stringy yellow discharge from her mouth.Pt denies fever, chills, and sore throat.   Past Medical History:  Diagnosis Date  . Medical history non-contributory   . No pertinent past medical history   . Trichomonal vulvovaginitis     There are no active problems to display for this patient.   Past Surgical History:  Procedure Laterality Date  . NO PAST SURGERIES      OB History    Gravida Para Term Preterm AB Living   2 2 2  0 0 2   SAB TAB Ectopic Multiple Live Births   0 0 0 0 2       Home Medications    Prior to Admission medications   Medication Sig Start Date End Date Taking? Authorizing Provider  amoxicillin (AMOXIL) 500 MG capsule Take 1 capsule (500 mg total) by mouth 2 (two) times daily. 01/12/16   Shadie Sweatman Orlene Och, NP  aspirin-acetaminophen-caffeine (EXCEDRIN MIGRAINE) [phone] MG tablet Take 1 tablet by mouth every 6 (six) hours as needed for headache.    Historical Provider, MD  cephALEXin (KEFLEX) 500 MG capsule Take 1 capsule (500 mg total) by  mouth 4 (four) times daily. Patient not taking: Reported on 11/27/2015 09/12/15   Roxy Horseman, PA-C  ibuprofen (ADVIL,MOTRIN) 600 MG tablet Take 1 tablet (600 mg total) by mouth every 6 (six) hours as needed. 01/12/16   Rachelanne Whidby Orlene Och, NP  naproxen (NAPROSYN) 500 MG tablet Take 1 tablet (500 mg total) by mouth 2 (two) times daily with a meal. Patient not taking: Reported on 11/27/2015 10/04/15   Garlon Hatchet, PA-C  oxyCODONE-acetaminophen (PERCOCET/ROXICET) 5-325 MG per tablet Take 1-2 tablets by mouth every 6 (six) hours as needed for severe pain. Patient not taking: Reported on 11/27/2015 04/16/13   Santiago Glad, PA-C  penicillin v potassium (VEETID) 500 MG tablet Take 1 tablet (500 mg total) by mouth 4 (four) times daily. Patient not taking: Reported on 11/27/2015 10/04/15   Garlon Hatchet, PA-C  predniSONE (DELTASONE) 10 MG tablet Take 6 tablets  today, on day 2 take 5 tablets, day 3 take 4 tablets, day 4 take 3 tablets, day 5 take  2 tablets and 1 tablet the last day Patient not taking: Reported on 11/27/2015 05/18/15   Tommi Rumps, PA-C    Family History Family History  Problem Relation Age of Onset  . Anesthesia problems Neg Hx     Social History Social History  Substance Use Topics  . Smoking status: Current Every Day Smoker  Packs/day: 0.30    Types: Cigarettes  . Smokeless tobacco: Never Used  . Alcohol use No     Allergies   Codeine; Tramadol; and Vicodin [hydrocodone-acetaminophen]   Review of Systems Review of Systems  Constitutional: Negative for chills and fever.  HENT: Positive for dental problem. Negative for sore throat.        Yellow discharge from gums  Respiratory: Negative for cough.   Gastrointestinal: Negative for abdominal pain, nausea and vomiting.  Musculoskeletal: Negative for neck pain.  Skin: Negative for rash.  Neurological: Positive for headaches. Negative for syncope.  Psychiatric/Behavioral: Negative for confusion.     Physical  Exam Updated Vital Signs BP 116/74   Pulse 78   Temp 97.5 F (36.4 C)   Resp 19   Ht 5\' 7"  (1.702 m)   Wt 56.7 kg   SpO2 99%   BMI 19.58 kg/m   Physical Exam  Constitutional: She appears well-developed and well-nourished. No distress.  HENT:  Head: Normocephalic and atraumatic.  Small amount of yellow drainage on upper gums. Small anterior cervical lympth adenopathy.  No erythema, nothing to suggest an abscess  Eyes: Conjunctivae are normal.  Neck: Neck supple.  Cardiovascular: Normal rate and regular rhythm.   No murmur heard. Pulmonary/Chest: Effort normal and breath sounds normal. No respiratory distress.  Abdominal: Soft. There is no tenderness.  Musculoskeletal: She exhibits no edema.  Neurological: She is alert.  Skin: Skin is warm and dry.  Psychiatric: She has a normal mood and affect.  Nursing note and vitals reviewed.    ED Treatments / Results  DIAGNOSTIC STUDIES:  Oxygen Saturation is 100% on RA, normal by my interpretation.    COORDINATION OF CARE:  8:49 PM Will give amoxicillin and ibuprofen 600mg .  Discussed treatment plan with pt at bedside and pt agreed to plan.  Labs (all labs ordered are listed, but only abnormal results are displayed) Labs Reviewed - No data to display Procedures Procedures (including critical care time)  Medications Ordered in ED Medications  oxyCODONE-acetaminophen (PERCOCET/ROXICET) 5-325 MG per tablet 1 tablet (1 tablet Oral Given 01/12/16 2058)  amoxicillin (AMOXIL) capsule 500 mg (500 mg Oral Given 01/12/16 2058)     Initial Impression / Assessment and Plan / ED Course  I have reviewed the triage vital signs and the nursing notes.  Pertinent labs & imaging results that were available during my care of the patient were reviewed by me and considered in my medical decision making (see chart for details).  Clinical Course  antibiotics and pain management with NSAIDS  Final Clinical Impressions(s) / ED Diagnoses  25  y.o. female with gum pain s/p multiple teeth extractions. 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 She will f/u with her dentist as scheduled in a few days.   Final diagnoses:  Pain in gums    New Prescriptions Discharge Medication List as of 01/12/2016  8:52 PM    START taking these medications   Details  amoxicillin (AMOXIL) 500 MG capsule Take 1 capsule (500 mg total) by mouth 2 (two) times daily., Starting Tue 01/12/2016, Print      I personally performed the services described in this documentation, which was scribed in my presence. The recorded information has been reviewed and is accurate.     [address]Quantia Grullon [place] Justman, NP 01/13/16 1709    Raeford RazorStephen Kohut, MD 01/23/16 [phone]

## 2016-01-12 NOTE — ED Triage Notes (Signed)
Pt states that last week she had all her teeth extracted, and has had pain since. Denies fevers.

## 2016-02-12 ENCOUNTER — Emergency Department (HOSPITAL_COMMUNITY)
Admission: EM | Admit: 2016-02-12 | Discharge: 2016-02-12 | Disposition: A | Discharge status: Home / Self Care | Payer: Medicaid Other | Attending: Emergency Medicine | Admitting: Emergency Medicine

## 2016-02-12 ENCOUNTER — Emergency Department (HOSPITAL_COMMUNITY): Payer: Medicaid Other

## 2016-02-12 ENCOUNTER — Encounter (HOSPITAL_COMMUNITY): Payer: Self-pay | Admitting: Emergency Medicine

## 2016-02-12 DIAGNOSIS — R51 Headache: Secondary | ICD-10-CM

## 2016-02-12 DIAGNOSIS — M545 Low back pain, unspecified: Secondary | ICD-10-CM

## 2016-02-12 DIAGNOSIS — Y92410 Unspecified street and highway as the place of occurrence of the external cause: Secondary | ICD-10-CM | POA: Diagnosis not present

## 2016-02-12 DIAGNOSIS — R519 Headache, unspecified: Secondary | ICD-10-CM

## 2016-02-12 DIAGNOSIS — F17210 Nicotine dependence, cigarettes, uncomplicated: Secondary | ICD-10-CM | POA: Insufficient documentation

## 2016-02-12 DIAGNOSIS — S0083XA Contusion of other part of head, initial encounter: Secondary | ICD-10-CM

## 2016-02-12 DIAGNOSIS — Z7982 Long term (current) use of aspirin: Secondary | ICD-10-CM | POA: Diagnosis not present

## 2016-02-12 DIAGNOSIS — Y999 Unspecified external cause status: Secondary | ICD-10-CM | POA: Insufficient documentation

## 2016-02-12 DIAGNOSIS — Y939 Activity, unspecified: Secondary | ICD-10-CM | POA: Diagnosis not present

## 2016-02-12 DIAGNOSIS — S0990XA Unspecified injury of head, initial encounter: Secondary | ICD-10-CM | POA: Diagnosis present

## 2016-02-12 LAB — POC URINE PREG, ED: Preg Test, Ur: NEGATIVE

## 2016-02-12 MED ORDER — CYCLOBENZAPRINE HCL 10 MG PO TABS: 10.0000 mg | ORAL_TABLET | Freq: Two times a day (BID) | ORAL | 0 refills | Status: AC | PRN

## 2016-02-12 MED ORDER — OXYCODONE-ACETAMINOPHEN 5-325 MG PO TABS
1.0000 | ORAL_TABLET | Freq: Once | ORAL | Status: AC
  Administered 2016-02-12: 1 via ORAL
  Filled 2016-02-12: qty 1

## 2016-02-12 MED ORDER — IBUPROFEN 600 MG PO TABS: 600.0000 mg | ORAL_TABLET | Freq: Four times a day (QID) | ORAL | 0 refills | Status: AC | PRN

## 2016-02-12 NOTE — Discharge Instructions (Signed)
All of your imaging today has been normal. Please follow-up with her primary care doctor concerning your back as discussed. You may take ibuprofen and Tylenol for pain. May use Flexeril for muscle spasms. Please do not drive while taking flexeril. It will make you sleeping. Get plenty of rest. May use heating pad to help with neck pain. Continue to ice your face and burn on left wrist. Please return to the ED if your symptoms worsen or if you develop severe headache, vision changes, worsening pain, or for any other reason.

## 2016-02-12 NOTE — ED Notes (Signed)
EDP at bedside  
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
